# Patient Record
Sex: Female | Born: 1980 | Race: White | Hispanic: No | Marital: Single | Smoking: Current every day smoker
Health system: Southern US, Community
[De-identification: ages and names within clinical notes are randomized; demographics above are authoritative.]

## PROBLEM LIST (undated history)

## (undated) DIAGNOSIS — O2492 Unspecified diabetes mellitus in childbirth: Secondary | ICD-10-CM

## (undated) DIAGNOSIS — G43909 Migraine, unspecified, not intractable, without status migrainosus: Secondary | ICD-10-CM

## (undated) HISTORY — PX: EXTERNAL AUDITORY CANAL RECONSTRUCTION: SHX428

---
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
## 2003-02-20 ENCOUNTER — Encounter (INDEPENDENT_AMBULATORY_CARE_PROVIDER_SITE_OTHER): Payer: Self-pay | Admitting: Specialist

## 2003-02-20 ENCOUNTER — Ambulatory Visit (HOSPITAL_COMMUNITY): Admission: EM | Admit: 2003-02-20 | Discharge: 2003-02-20 | Payer: Self-pay | Admitting: *Deleted

## 2003-09-28 ENCOUNTER — Inpatient Hospital Stay (HOSPITAL_COMMUNITY): Admission: AD | Admit: 2003-09-28 | Discharge: 2003-09-28 | Payer: Self-pay | Admitting: Obstetrics and Gynecology

## 2003-11-01 ENCOUNTER — Other Ambulatory Visit: Admission: RE | Admit: 2003-11-01 | Discharge: 2003-11-01 | Payer: Self-pay | Admitting: Obstetrics and Gynecology

## 2004-02-13 ENCOUNTER — Inpatient Hospital Stay (HOSPITAL_COMMUNITY): Admission: AD | Admit: 2004-02-13 | Discharge: 2004-02-14 | Payer: Self-pay | Admitting: Obstetrics and Gynecology

## 2004-03-28 ENCOUNTER — Inpatient Hospital Stay (HOSPITAL_COMMUNITY): Admission: AD | Admit: 2004-03-28 | Discharge: 2004-03-28 | Payer: Self-pay | Admitting: Obstetrics and Gynecology

## 2004-03-29 ENCOUNTER — Inpatient Hospital Stay (HOSPITAL_COMMUNITY): Admission: AD | Admit: 2004-03-29 | Discharge: 2004-03-30 | Payer: Self-pay | Admitting: Obstetrics and Gynecology

## 2004-04-01 ENCOUNTER — Inpatient Hospital Stay (HOSPITAL_COMMUNITY): Admission: AD | Admit: 2004-04-01 | Discharge: 2004-04-01 | Payer: Self-pay | Admitting: Obstetrics and Gynecology

## 2004-05-01 ENCOUNTER — Inpatient Hospital Stay (HOSPITAL_COMMUNITY): Admission: AD | Admit: 2004-05-01 | Discharge: 2004-05-01 | Payer: Self-pay | Admitting: Obstetrics and Gynecology

## 2004-05-11 ENCOUNTER — Inpatient Hospital Stay (HOSPITAL_COMMUNITY): Admission: AD | Admit: 2004-05-11 | Discharge: 2004-05-12 | Payer: Self-pay | Admitting: Obstetrics and Gynecology

## 2004-05-22 ENCOUNTER — Inpatient Hospital Stay (HOSPITAL_COMMUNITY): Admission: AD | Admit: 2004-05-22 | Discharge: 2004-05-22 | Payer: Self-pay | Admitting: Obstetrics and Gynecology

## 2004-05-23 ENCOUNTER — Inpatient Hospital Stay (HOSPITAL_COMMUNITY): Admission: AD | Admit: 2004-05-23 | Discharge: 2004-05-25 | Payer: Self-pay | Admitting: Obstetrics and Gynecology

## 2007-12-07 ENCOUNTER — Inpatient Hospital Stay (HOSPITAL_COMMUNITY): Admission: AD | Admit: 2007-12-07 | Discharge: 2007-12-07 | Payer: Self-pay | Admitting: Obstetrics and Gynecology

## 2009-05-10 ENCOUNTER — Emergency Department (HOSPITAL_COMMUNITY): Admission: EM | Admit: 2009-05-10 | Discharge: 2009-05-10 | Payer: Self-pay | Admitting: Family Medicine

## 2009-05-27 ENCOUNTER — Inpatient Hospital Stay (HOSPITAL_COMMUNITY): Admission: AD | Admit: 2009-05-27 | Discharge: 2009-05-27 | Payer: Self-pay | Admitting: Obstetrics and Gynecology

## 2009-07-08 ENCOUNTER — Inpatient Hospital Stay (HOSPITAL_COMMUNITY): Admission: AD | Admit: 2009-07-08 | Discharge: 2009-07-08 | Payer: Self-pay | Admitting: Obstetrics and Gynecology

## 2009-07-08 ENCOUNTER — Ambulatory Visit: Payer: Self-pay | Admitting: Family

## 2009-09-07 ENCOUNTER — Inpatient Hospital Stay (HOSPITAL_COMMUNITY): Admission: RE | Admit: 2009-09-07 | Discharge: 2009-09-09 | Payer: Self-pay | Admitting: Obstetrics and Gynecology

## 2009-10-17 ENCOUNTER — Emergency Department (HOSPITAL_COMMUNITY): Admission: EM | Admit: 2009-10-17 | Discharge: 2009-10-17 | Payer: Self-pay | Admitting: Family Medicine

## 2009-12-27 ENCOUNTER — Ambulatory Visit (HOSPITAL_COMMUNITY): Admission: RE | Admit: 2009-12-27 | Discharge: 2009-12-27 | Payer: Self-pay | Admitting: Obstetrics and Gynecology

## 2010-09-23 LAB — URINALYSIS, ROUTINE W REFLEX MICROSCOPIC
Bilirubin Urine: NEGATIVE
Glucose, UA: NEGATIVE mg/dL
Nitrite: NEGATIVE
Protein, ur: NEGATIVE mg/dL

## 2010-09-23 LAB — GLUCOSE, CAPILLARY: Glucose-Capillary: 101 mg/dL — ABNORMAL HIGH (ref 70–99)

## 2010-09-23 LAB — URINE MICROSCOPIC-ADD ON

## 2010-09-30 LAB — CBC
HCT: 34.2 % — ABNORMAL LOW (ref 36.0–46.0)
Hemoglobin: 11.3 g/dL — ABNORMAL LOW (ref 12.0–15.0)
Hemoglobin: 11.7 g/dL — ABNORMAL LOW (ref 12.0–15.0)
MCHC: 33.1 g/dL (ref 30.0–36.0)
MCHC: 33.3 g/dL (ref 30.0–36.0)
MCV: 90.9 fL (ref 78.0–100.0)
Platelets: 129 10*3/uL — ABNORMAL LOW (ref 150–400)
RDW: 15.4 % (ref 11.5–15.5)
WBC: 11.5 10*3/uL — ABNORMAL HIGH (ref 4.0–10.5)

## 2010-09-30 LAB — GLUCOSE, RANDOM: Glucose, Bld: 111 mg/dL — ABNORMAL HIGH (ref 70–99)
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
## 2010-11-23 NOTE — Op Note (Signed)
[name], [name]                            ACCOUNT NO.:  [id number]   MEDICAL RECORD NO.:  [id number]                   PATIENT TYPE:  EMS   LOCATION:  ED                                   FACILITY:  [facility]   PHYSICIAN:  Huel Cote, M.D.              DATE OF BIRTH:  May 21, 1981   DATE OF PROCEDURE:  02/20/2003  DATE OF DISCHARGE:                                 OPERATIVE REPORT   PREOPERATIVE DIAGNOSIS:  Incomplete abortion.   POSTOPERATIVE DIAGNOSIS:  Incomplete abortion.   OPERATION/PROCEDURE:  Suction dilation and curettage.   SURGEON:  Huel Cote, M.D.   ANESTHESIA:  LMAC.   ESTIMATED BLOOD LOSS:  50 mL.   URINE OUTPUT:  50 mL straight catheter prior to procedure.   IV FLUIDS:  Approximately 1200 mL lactated Ringer's.   FINDINGS:  Uterus is approximately eight weeks size prior to the procedure.  It decreased to approximately six weeks size after minimal amount of  products of conception were removed.  The os was fingertip dilated prior to  procedure.   DESCRIPTION OF PROCEDURE:  The patient was taken to the operating room [facility]  anesthesia was obtained without difficulty.  She was then prepped and draped  in the normal sterile fashion in the dorsal lithotomy position. Speculum was  then placed within the vagina, cervix identified, grasped with a single-  tooth tenaculum on the anterior lip and the uterine sound was easily  introduced into a partially dilated cervix and sounded to approximately 8-9  cm.  The serial dilators were then utilized and cervix was dilated up 24.  The dilators passed without any resistance and the cervix was indeed  partially dilated at the outset of the procedure.  With cervix opened, the 8  mm suction curettage was introduced into the uterus and in two passes,  additional tissue was removed.  After these two passes, no further tissue  was obtained.  Suction was discontinued and sharp curettage was placed  within the uterine  cavity.  Some small residual amount of tissue was scraped  and added to the previous specimen.  There was no other major tissue noted.  The suction was placed within the uterus and suction applied and two more  passes with no further tissue obtained.  At this point all instrument and  sponges were removed from the vagina.  The cervix was visualized and no  active bleeding was noted.  Therefore, the patient was awakened and taken to  the recovery room in stable condition.                                               Huel Cote, M.D.    KR/MEDQ  D:  02/20/2003  T:  02/20/2003  Job:  [id number]

## 2011-04-04 LAB — CBC
Hemoglobin: 13.4
MCHC: 34.9
MCV: 92.5
Platelets: 232
RBC: 4.14
RDW: 13.3

## 2011-04-04 LAB — WET PREP, GENITAL
Clue Cells Wet Prep HPF POC: NONE SEEN
Trich, Wet Prep: NONE SEEN

## 2011-04-04 LAB — URINE CULTURE

## 2011-04-04 LAB — URINALYSIS, ROUTINE W REFLEX MICROSCOPIC
Nitrite: NEGATIVE
Specific Gravity, Urine: >1.03 — ABNORMAL HIGH
pH: 6

## 2011-04-04 LAB — URINE MICROSCOPIC-ADD ON

## 2011-04-04 LAB — GC/CHLAMYDIA PROBE AMP, GENITAL
Chlamydia, DNA Probe: NEGATIVE
GC Probe Amp, Genital: NEGATIVE

## 2011-04-04 LAB — ABO/RH: ABO/RH(D): A POS

## 2013-03-02 ENCOUNTER — Emergency Department (HOSPITAL_COMMUNITY)
Admission: EM | Admit: 2013-03-02 | Discharge: 2013-03-03 | Discharge status: Against Medical Advice | Payer: BC Managed Care – PPO | Attending: Emergency Medicine | Admitting: Emergency Medicine

## 2013-03-02 ENCOUNTER — Encounter (HOSPITAL_COMMUNITY): Payer: Self-pay | Admitting: Emergency Medicine

## 2013-03-02 DIAGNOSIS — N949 Unspecified condition associated with female genital organs and menstrual cycle: Secondary | ICD-10-CM | POA: Insufficient documentation

## 2013-03-02 DIAGNOSIS — R1031 Right lower quadrant pain: Secondary | ICD-10-CM | POA: Diagnosis not present

## 2013-03-02 DIAGNOSIS — G43909 Migraine, unspecified, not intractable, without status migrainosus: Secondary | ICD-10-CM | POA: Insufficient documentation

## 2013-03-02 DIAGNOSIS — R110 Nausea: Secondary | ICD-10-CM | POA: Diagnosis not present

## 2013-03-02 DIAGNOSIS — F17200 Nicotine dependence, unspecified, uncomplicated: Secondary | ICD-10-CM | POA: Insufficient documentation

## 2013-03-02 DIAGNOSIS — Z9089 Acquired absence of other organs: Secondary | ICD-10-CM | POA: Insufficient documentation

## 2013-03-02 DIAGNOSIS — Z8632 Personal history of gestational diabetes: Secondary | ICD-10-CM | POA: Insufficient documentation

## 2013-03-02 DIAGNOSIS — Z8719 Personal history of other diseases of the digestive system: Secondary | ICD-10-CM | POA: Insufficient documentation

## 2013-03-02 DIAGNOSIS — R102 Pelvic and perineal pain: Secondary | ICD-10-CM

## 2013-03-02 DIAGNOSIS — Z8711 Personal history of peptic ulcer disease: Secondary | ICD-10-CM | POA: Insufficient documentation

## 2013-03-02 DIAGNOSIS — Z880 Allergy status to penicillin: Secondary | ICD-10-CM | POA: Insufficient documentation

## 2013-03-02 DIAGNOSIS — R51 Headache: Secondary | ICD-10-CM | POA: Diagnosis present

## 2013-03-02 HISTORY — DX: Migraine, unspecified, not intractable, without status migrainosus: G43.909

## 2013-03-02 HISTORY — DX: Unspecified diabetes mellitus in childbirth: O24.92

## 2013-03-02 LAB — COMPREHENSIVE METABOLIC PANEL
ALT: 20 U/L (ref 0–35)
Alkaline Phosphatase: 61 U/L (ref 39–117)
BUN: 17 mg/dL (ref 6–23)
CO2: 23 mEq/L (ref 19–32)
Calcium: 9.7 mg/dL (ref 8.4–10.5)
GFR calc Af Amer: >90 mL/min (ref >90)
Potassium: 3.8 mEq/L (ref 3.5–5.1)
Total Bilirubin: 0.2 mg/dL — ABNORMAL LOW (ref 0.3–1.2)
Total Protein: 7.4 g/dL (ref 6.0–8.3)

## 2013-03-02 LAB — CBC WITH DIFFERENTIAL/PLATELET
Basophils Absolute: 0 10*3/uL (ref 0.0–0.1)
Basophils Relative: 0 % (ref 0–1)
Eosinophils Relative: 1 % (ref 0–5)
Lymphs Abs: 3 10*3/uL (ref 0.7–4.0)
MCV: 87.5 fL (ref 78.0–100.0)
Monocytes Absolute: 0.4 10*3/uL (ref 0.1–1.0)
Neutro Abs: 6.1 10*3/uL (ref 1.7–7.7)
Neutrophils Relative %: 64 % (ref 43–77)
Platelets: 221 10*3/uL (ref 150–400)
RBC: 4.96 MIL/uL (ref 3.87–5.11)
RDW: 13.5 % (ref 11.5–15.5)

## 2013-03-02 LAB — URINALYSIS, ROUTINE W REFLEX MICROSCOPIC
Bilirubin Urine: NEGATIVE
Glucose, UA: NEGATIVE mg/dL
Protein, ur: NEGATIVE mg/dL
Urobilinogen, UA: 0.2 mg/dL (ref 0.0–1.0)

## 2013-03-02 MED ORDER — ONDANSETRON 4 MG PO TBDP
8.0000 mg | ORAL_TABLET | Freq: Once | ORAL | Status: AC
  Administered 2013-03-02: 8 mg via ORAL
  Filled 2013-03-02: qty 2

## 2013-03-02 MED ORDER — KETOROLAC TROMETHAMINE 30 MG/ML IJ SOLN
30.0000 mg | Freq: Once | INTRAMUSCULAR | Status: AC
  Administered 2013-03-02: 30 mg via INTRAVENOUS
  Filled 2013-03-02: qty 1

## 2013-03-02 MED ORDER — SODIUM CHLORIDE 0.9 % IV BOLUS (SEPSIS)
1000.0000 mL | Freq: Once | INTRAVENOUS | Status: AC
  Administered 2013-03-02: 1000 mL via INTRAVENOUS

## 2013-03-02 MED ORDER — SUMATRIPTAN SUCCINATE 6 MG/0.5ML ~~LOC~~ SOLN
6.0000 mg | Freq: Once | SUBCUTANEOUS | Status: AC
  Administered 2013-03-02: 6 mg via SUBCUTANEOUS
  Filled 2013-03-02: qty 0.5

## 2013-03-02 MED ORDER — ONDANSETRON HCL 4 MG/2ML IJ SOLN
4.0000 mg | Freq: Once | INTRAMUSCULAR | Status: AC
  Administered 2013-03-02: 4 mg via INTRAVENOUS
  Filled 2013-03-02: qty 2

## 2013-03-02 NOTE — ED Provider Notes (Signed)
CSN: [id number]     Arrival date & time 03/02/13  1945 History   First MD Initiated Contact with Patient 03/02/13 2149     Chief Complaint  Patient presents with  . Abdominal Pain  . Headache   (Consider location/radiation/quality/duration/timing/severity/associated sxs/prior Treatment) The history is provided by the patient and medical records. No language interpreter was used.    Amy Lindsey is a 32 y.o. female  with a hx of Gestational DM, migraine, GERD, PUD, ADHD, tonsillectomy, cholecystectomy presents to the Emergency Department complaining of gradual, persistent, progressively worsening headache onset 1 week ago.  Pt describes HA as pressure that begins behind her right eye that radiates around the right side of her head to the back.  Denies fever, chills, neck pain, neck stiffness.  Pt with Hx of migraines and the pain feels the same but she usually has light/noise/smell sensitivity which is absent at this time. Pt denies nasal congestions, otalgia, cough, chest congestion.  Pt with associated lower abdominal pain.  Pt states pain is constant and dull with intermittent stabbing episodes. The pain began 3 days and has been unchanged.  Nothing makes any of the symptoms better and bright lights make the headache worse.   Pt denies fever, chills, neck pain, chest pain, V/D, weakness, dizziness, syncope, dysuria, hematuria.   Sexually active with 1 partner for 7 years, no hx of STD.     Past Medical History  Diagnosis Date  . Migraine   . 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 Gallbladder surgery  1995    removed  . Adenoidectomy, tonsillectomy and myringotomy with tube placement  1998  . External auditory canal reconstruction      eardrum repair    History reviewed. No pertinent family history. History  Substance Use Topics  . Smoking status: Current Every Day Smoker -- 0.50 packs/day    Types: Cigarettes  . Smokeless tobacco:  Not on file  . Alcohol Use: No   OB History   Grav Para Term Preterm Abortions TAB SAB Ect Mult Living                 Review of Systems  Constitutional: Negative for fever, diaphoresis, appetite change, fatigue and unexpected weight change.  HENT: Negative for mouth sores and neck stiffness.   Eyes: Negative for visual disturbance.  Respiratory: Negative for cough, chest tightness, shortness of breath and wheezing.   Cardiovascular: Negative for chest pain.  Gastrointestinal: Positive for nausea and abdominal pain. Negative for vomiting, diarrhea and constipation.  Endocrine: Negative for polydipsia, polyphagia and polyuria.  Genitourinary: Negative for dysuria, urgency, frequency and hematuria.  Musculoskeletal: Negative for back pain.  Skin: Negative for rash.  Allergic/Immunologic: Negative for immunocompromised state.  Neurological: Positive for headaches. Negative for syncope and light-headedness.  Hematological: Does not bruise/bleed easily.  Psychiatric/Behavioral: Negative for sleep disturbance. The patient is not nervous/anxious.     Allergies  Cephalosporins; Penicillins; and Wellbutrin  Home Medications   Current Outpatient Rx  Name  Route  Sig  Dispense  Refill  . SUMAtriptan (IMITREX) 100 MG tablet   Oral   Take 1 tablet (100 mg total) by mouth every 2 (two) hours as needed for migraine.   10 tablet   0    BP 115/55  Pulse 66  Temp(Src) 97.5 F (36.4 C) (Oral)  Resp 16  SpO2 100% Physical Exam  Nursing note and vitals reviewed. Constitutional: She is oriented  to person, place, and time. She appears well-developed and well-nourished. No distress.  Awake, alert, nontoxic appearance  HENT:  Head: Normocephalic and atraumatic.  Mouth/Throat: Oropharynx is clear and moist. No oropharyngeal exudate.  Eyes: Conjunctivae and EOM are normal. Pupils are equal, round, and reactive to light. No scleral icterus.  Neck: Normal range of motion. Neck supple.   Cardiovascular: Normal rate, regular rhythm, normal heart sounds and intact distal pulses.   No murmur heard. Pulmonary/Chest: Effort normal and breath sounds normal. No respiratory distress. She has no wheezes. She has no rales.  Abdominal: Soft. Bowel sounds are normal. She exhibits no distension and no mass. There is tenderness in the right lower quadrant. There is guarding (mild). There is no rigidity, no rebound and no tenderness at McBurney's point.  Abdominal tenderness to the right lower quadrant/pelvic region without rebound  Musculoskeletal: Normal range of motion. She exhibits no edema.  Lymphadenopathy:    She has no cervical adenopathy.  Neurological: She is alert and oriented to person, place, and time. She has normal reflexes. No cranial nerve deficit. She exhibits normal muscle tone. Coordination normal. GCS eye subscore is 4. GCS verbal subscore is 5. GCS motor subscore is 6.  Speech is clear and goal oriented, follows commands Cranial nerves III - XII without deficit, no facial droop Normal strength in upper and lower extremities bilaterally, strong and equal grip strength Sensation normal to light and sharp touch Moves extremities without ataxia, coordination intact Normal finger to nose and rapid alternating movements Neg romberg, no pronator drift Normal gait Normal heel-shin and balance  Skin: Skin is warm and dry. No rash noted. She is not diaphoretic.  Psychiatric: She has a normal mood and affect. Her behavior is normal. Judgment and thought content normal.    ED Course  Procedures (including critical care time) Labs Review Labs Reviewed  CBC WITH DIFFERENTIAL - Abnormal; Notable for the following:    Hemoglobin 15.2 (*)    All other components within normal limits  COMPREHENSIVE METABOLIC PANEL - Abnormal; Notable for the following:    Total Bilirubin 0.2 (*)    All other components within normal limits  URINALYSIS, ROUTINE W REFLEX MICROSCOPIC - Abnormal;  Notable for the following:    Hgb urine dipstick TRACE (*)    All other components within normal limits  URINE MICROSCOPIC-ADD ON - Abnormal; Notable for the following:    Squamous Epithelial / LPF FEW (*)    All other components within normal limits  WET PREP, GENITAL  GC/CHLAMYDIA PROBE AMP   Imaging Review No results found.  MDM   1. Migraine headache   2. Pelvic pain      GENESEE NASE presents with headache and right lower quadrant abdominal/pelvic pain.  Patient headache not consistent with her previous migraines as it lacks some of the common symptoms however she states the pain itself feels the same.  We'll treat her migraine headache and reassess.  Right lower quadrant tenderness more pelvic on exam than abdomen. Patient without leukocytosis and afebrile. We'll perform pelvic exam and reassess.  11:45pm Patient reports complete resolution of headache and nausea. Persistent right pelvic pain. She reports that her son is at home having an asthma attack and she must leave. She is refusing pelvic exam and further exam of her abdomen.  Discussed at length with her the importance of following up on this. Also discussed precautions for early appendicitis. Patient reports she will followup with her OB/GYN tomorrow morning for further  evaluation of her pelvic pain.  She is alert, oriented, nontoxic, nonseptic appearing. She remains afebrile and non-tachycardic.  I have also discussed reasons to return immediately to the ER.  Patient expresses understanding and agrees with plan.  Patient to be discharged AMA.    Dahlia Client Eythan Jayne, PA-C 03/03/13 [phone]

## 2013-03-02 NOTE — ED Notes (Signed)
Pt ambulated to the ED complaining of of severe headache for the past week. Pt states that she use to suffer off bad migraines but have not had one for a long time. The patient states that she is nauseous and she has abdominal sharp pain to the right lower quadrant, that worsens when walking with no relief.

## 2013-03-02 NOTE — ED Notes (Signed)
Triage supervised by Gennie Alma RN. Headache for past week, sharp lower abdominal pain worsening today radiating to groin. No vaginal bleeding or discharge. NO focal neuro Sx

## 2013-03-03 DIAGNOSIS — G43909 Migraine, unspecified, not intractable, without status migrainosus: Secondary | ICD-10-CM | POA: Diagnosis not present

## 2013-03-03 MED ORDER — SUMATRIPTAN SUCCINATE 100 MG PO TABS: 100.0000 mg | ORAL_TABLET | ORAL | Status: DC | PRN

## 2013-03-03 NOTE — ED Provider Notes (Signed)
Medical screening examination/treatment/procedure(s) were performed by non-physician practitioner and as supervising physician I was immediately available for consultation/collaboration.   Graeden Bitner, MD 03/03/13 0030 

## 2013-03-03 NOTE — ED Notes (Signed)
Pt. Refused pelvic told MD she had to leave. PA-C informed her she would be considered AMA. Pt. Still insisted on leaving anyway.

## 2013-04-15 ENCOUNTER — Emergency Department (HOSPITAL_COMMUNITY)
Admission: EM | Admit: 2013-04-15 | Discharge: 2013-04-15 | Disposition: A | Discharge status: Home / Self Care | Payer: BC Managed Care – PPO | Attending: Emergency Medicine | Admitting: Emergency Medicine

## 2013-04-15 ENCOUNTER — Encounter (HOSPITAL_COMMUNITY): Payer: Self-pay | Admitting: Emergency Medicine

## 2013-04-15 ENCOUNTER — Emergency Department (HOSPITAL_COMMUNITY): Payer: BC Managed Care – PPO

## 2013-04-15 DIAGNOSIS — S93402A Sprain of unspecified ligament of left ankle, initial encounter: Secondary | ICD-10-CM

## 2013-04-15 DIAGNOSIS — G43909 Migraine, unspecified, not intractable, without status migrainosus: Secondary | ICD-10-CM | POA: Insufficient documentation

## 2013-04-15 DIAGNOSIS — Y9289 Other specified places as the place of occurrence of the external cause: Secondary | ICD-10-CM | POA: Insufficient documentation

## 2013-04-15 DIAGNOSIS — Y9389 Activity, other specified: Secondary | ICD-10-CM | POA: Insufficient documentation

## 2013-04-15 DIAGNOSIS — X500XXA Overexertion from strenuous movement or load, initial encounter: Secondary | ICD-10-CM | POA: Insufficient documentation

## 2013-04-15 DIAGNOSIS — Z79899 Other long term (current) drug therapy: Secondary | ICD-10-CM | POA: Insufficient documentation

## 2013-04-15 DIAGNOSIS — R296 Repeated falls: Secondary | ICD-10-CM | POA: Insufficient documentation

## 2013-04-15 DIAGNOSIS — R21 Rash and other nonspecific skin eruption: Secondary | ICD-10-CM | POA: Insufficient documentation

## 2013-04-15 DIAGNOSIS — S93409A Sprain of unspecified ligament of unspecified ankle, initial encounter: Secondary | ICD-10-CM | POA: Insufficient documentation

## 2013-04-15 DIAGNOSIS — F17200 Nicotine dependence, unspecified, uncomplicated: Secondary | ICD-10-CM | POA: Insufficient documentation

## 2013-04-15 DIAGNOSIS — E119 Type 2 diabetes mellitus without complications: Secondary | ICD-10-CM | POA: Insufficient documentation

## 2013-04-15 DIAGNOSIS — Z880 Allergy status to penicillin: Secondary | ICD-10-CM | POA: Insufficient documentation

## 2013-04-15 MED ORDER — HYDROCODONE-ACETAMINOPHEN 5-325 MG PO TABS: 2.0000 | ORAL_TABLET | ORAL | Status: DC | PRN

## 2013-04-15 MED ORDER — HYDROCODONE-ACETAMINOPHEN 5-325 MG PO TABS
1.0000 | ORAL_TABLET | Freq: Once | ORAL | Status: AC
  Administered 2013-04-15: 1 via ORAL
  Filled 2013-04-15: qty 1

## 2013-04-15 MED ORDER — IBUPROFEN 800 MG PO TABS: 800.0000 mg | ORAL_TABLET | Freq: Three times a day (TID) | ORAL | Status: DC

## 2013-04-15 NOTE — ED Notes (Signed)
Patient transported to X-ray 

## 2013-04-15 NOTE — ED Notes (Signed)
Pt from home c/o left ankle pain and right knee pain. Per pt. Pt rolled left ankle fell on knee yesterday.

## 2013-04-15 NOTE — ED Provider Notes (Signed)
CSN: [id number]     Arrival date & time 04/15/13  1037 History  This chart was scribed for Fayrene Helper, PA, working with Candyce Churn, MD, by Allene Dillon, ED Scribe. This patient was seen in room TR06C/TR06C and the patient's care was started at 10:42 AM.    No chief complaint on file.   The history is provided by the patient. No language interpreter was used.  HPI Comments: Amy Lindsey is a 32 y.o. female who presents to the Emergency Department complaining of "9/10" left ankle pain which began yesterday 8am. Pt states she was walking her dog on side walk and rolled on ankle causing her to fall on her right knee. Pt describes constant throbbing pain. Pt kept leg elevated, and iced with relief and took motrin, with which decreased swelling. Pt took motrin this morning with no pain relief. Pt has associated subjective numbness on first 3 left toes. Pt previously rolled ankle 4 months ago which healed well. Pt states she broke her ankle when she was younger but does not remember which ankle.     Pt also complains of right knee pain, Pt Neoporin with bandaging with mild relief. Pt describes pain as throbbing, burning, and "stretching".  Pt has associated swelling. Pain worsens with ambulation. Pt previously had riight knee injury when she was younger while playing basketball. Pt las tetanus shot was 3 years ago. Pt denies any surgery on knee. Pt denies weakness, other any other symptoms.   Allergies  Allergen Reactions  . Cephalosporins Hives  . Penicillins Hives  . Wellbutrin [Bupropion] Hives       Past Medical History  Diagnosis Date  . Migraine   . 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 Gallbladder surgery  1995    removed  . Adenoidectomy, tonsillectomy and myringotomy with tube placement  1998  . External auditory canal reconstruction      eardrum repair    No family history on file. History  Substance Use Topics   . Smoking status: Current Every Day Smoker -- 0.50 packs/day    Types: Cigarettes  . Smokeless tobacco: Not on file  . Alcohol Use: No   OB History   Grav Para Term Preterm Abortions TAB SAB Ect Mult Living                 Review of Systems  Musculoskeletal: Positive for myalgias (left ankle). Negative for gait problem.  Skin: Positive for rash (abbrasion on right knee).  Neurological: Positive for numbness (subjective numbness in first 3 left toes). Negative for weakness.    Allergies  Cephalosporins; Penicillins; and Wellbutrin  Home Medications   Current Outpatient Rx  Name  Route  Sig  Dispense  Refill  . SUMAtriptan (IMITREX) 100 MG tablet   Oral   Take 1 tablet (100 mg total) by mouth every 2 (two) hours as needed for migraine.   10 tablet   0    Triage Vitals: BP 107/68  Pulse 74  Temp(Src) 97.5 F (36.4 C) (Oral)  Resp 16  SpO2 100%  Physical Exam  Nursing note and vitals reviewed. Constitutional: She is oriented to person, place, and time. She appears well-developed and well-nourished. No distress.  HENT:  Head: Normocephalic and atraumatic.  Right Ear: External ear normal.  Left Ear: External ear normal.  Nose: Nose normal.  Mouth/Throat: Oropharynx is clear and moist.  Eyes: Conjunctivae are normal.  Neck: Normal  range of motion.  Cardiovascular: Normal rate, regular rhythm and normal heart sounds.   Pulmonary/Chest: Effort normal and breath sounds normal. No stridor. No respiratory distress. She has no wheezes. She has no rales.  Abdominal: Soft. She exhibits no distension.  Musculoskeletal: Normal range of motion. She exhibits tenderness.  Right knee small abrasion noted on the anterior lateral aspect of knee. Full ROM, mild tenderness to palpation. no obvious deformity. No foreign object noted.  Left ankle lateral malleolus, proximal dorsum of ankle with decreased plantar flexion and dorsal flexion. increasing pain with foot inversion and  eversion. No obvious deformity, no underline skin changes.  Neurological: She is alert and oriented to person, place, and time. She has normal strength.  Skin: Skin is warm and dry. She is not diaphoretic. No erythema.  Psychiatric: She has a normal mood and affect. Her behavior is normal.    ED Course  Procedures (including critical care time)  DIAGNOSTIC STUDIES: Oxygen Saturation is 100% on RA, normal by my interpretation.    COORDINATION OF CARE: 10:45 AM- Pt advised of plan for treatment which includes x-ray and pt agrees.  11:51 AM Xray neg for fx or dislocation.  Will d/c with ASO, crutches, pain medication and RICE therapy.  Pt agrees with plan.  Ortho referrall as needed.    Medications  HYDROcodone-acetaminophen (NORCO/VICODIN) 5-325 MG per tablet 1 tablet (1 tablet Oral Given 04/15/13 1131)      Labs Review Labs Reviewed - No data to display Imaging Review Dg Ankle Complete Left  04/15/2013   CLINICAL DATA:  32 year old female status post fall and twisting injury with pain and swelling.  EXAM: LEFT ANKLE COMPLETE - 3+ VIEW  COMPARISON:  None.  FINDINGS: Bone mineralization is within normal limits. Preserved mortise joint alignment. Talar dome intact. Small ossific fragment distal to the lateral malleolus, but appears to be chronic. No joint effusion identified. Calcaneus intact. No acute fracture identified.  IMPRESSION: Small chronic appearing ossific fragment adjacent to the lateral malleolus. No acute fracture or dislocation identified about the left ankle.   Electronically Signed   By: Augusto Gamble M.D.   On: 04/15/2013 11:36    EKG Interpretation   None       MDM   1. Left ankle sprain, initial encounter    BP 107/68  Pulse 74  Temp(Src) 97.5 F (36.4 C) (Oral)  Resp 16  SpO2 100%  LMP 03/31/2013  I have reviewed nursing notes and vital signs. I personally reviewed the imaging tests through PACS system  I reviewed available ER/hospitalization records  thought the EMR   I personally performed the services described in this documentation, which was scribed in my presence. The recorded information has been reviewed and is accurate.     Fayrene Helper, PA-C 04/15/13 1151

## 2013-04-17 NOTE — ED Provider Notes (Signed)
Medical screening examination/treatment/procedure(s) were performed by non-physician practitioner and as supervising physician I was immediately available for consultation/collaboration.   Candyce Churn, MD 04/17/13 [phone]

## 2013-09-11 ENCOUNTER — Emergency Department (HOSPITAL_COMMUNITY)
Admission: EM | Admit: 2013-09-11 | Discharge: 2013-09-11 | Disposition: A | Discharge status: Home / Self Care | Payer: BC Managed Care – PPO | Attending: Emergency Medicine | Admitting: Emergency Medicine

## 2013-09-11 ENCOUNTER — Encounter (HOSPITAL_COMMUNITY): Payer: Self-pay | Admitting: Emergency Medicine

## 2013-09-11 DIAGNOSIS — S99922A Unspecified injury of left foot, initial encounter: Secondary | ICD-10-CM

## 2013-09-11 DIAGNOSIS — Y929 Unspecified place or not applicable: Secondary | ICD-10-CM | POA: Insufficient documentation

## 2013-09-11 DIAGNOSIS — Z79899 Other long term (current) drug therapy: Secondary | ICD-10-CM | POA: Insufficient documentation

## 2013-09-11 DIAGNOSIS — W208XXA Other cause of strike by thrown, projected or falling object, initial encounter: Secondary | ICD-10-CM | POA: Insufficient documentation

## 2013-09-11 DIAGNOSIS — G43909 Migraine, unspecified, not intractable, without status migrainosus: Secondary | ICD-10-CM | POA: Insufficient documentation

## 2013-09-11 DIAGNOSIS — IMO0002 Reserved for concepts with insufficient information to code with codable children: Secondary | ICD-10-CM | POA: Insufficient documentation

## 2013-09-11 DIAGNOSIS — Z888 Allergy status to other drugs, medicaments and biological substances status: Secondary | ICD-10-CM | POA: Insufficient documentation

## 2013-09-11 DIAGNOSIS — F17200 Nicotine dependence, unspecified, uncomplicated: Secondary | ICD-10-CM | POA: Insufficient documentation

## 2013-09-11 DIAGNOSIS — Z881 Allergy status to other antibiotic agents status: Secondary | ICD-10-CM | POA: Insufficient documentation

## 2013-09-11 DIAGNOSIS — Y939 Activity, unspecified: Secondary | ICD-10-CM | POA: Insufficient documentation

## 2013-09-11 DIAGNOSIS — Z880 Allergy status to penicillin: Secondary | ICD-10-CM | POA: Insufficient documentation

## 2013-09-11 MED ORDER — OXYCODONE-ACETAMINOPHEN 5-325 MG PO TABS: 2.0000 | ORAL_TABLET | ORAL | Status: DC | PRN

## 2013-09-11 MED ORDER — HYDROCODONE-ACETAMINOPHEN 5-325 MG PO TABS
2.0000 | ORAL_TABLET | Freq: Once | ORAL | Status: AC
  Administered 2013-09-11: 2 via ORAL
  Filled 2013-09-11: qty 2

## 2013-09-11 NOTE — Discharge Instructions (Signed)
Take Percocet as needed for pain. Continue to ice and elevate the foot. Refer to attached documents for more information.

## 2013-09-11 NOTE — ED Provider Notes (Signed)
CSN: [id number]     Arrival date & time 09/11/13  1402 History  This chart was scribed for non-physician practitioner, Emilia Beck, PA-C, working with Enid Skeens, MD by Shari Heritage, ED Scribe. This patient was seen in room TR06C/TR06C and the patient's care was started at 3:53 PM.      Chief Complaint  Patient presents with  . Foot Injury     Patient is a 33 y.o. female presenting with foot injury.  Foot Injury Location:  Foot Injury: yes   Mechanism of injury: crush   Crush injury:    Mechanism:  Falling object Foot location:  L foot Pain details:    Severity:  Severe   Timing:  Constant Chronicity:  New   HPI Comments: Amy Lindsey is a 33 y.o. female who presents to the Emergency Department complaining of constant, severe left 3rd toe pain onset yesterday after patient dropped a dish on her foot. She states that pain is worse with weight bearing and ambulation. She took 800 mg of ibuprofen at home with only mild relief. Patient has a laceration to the left 3rd toe and bruising. She says that she cleaned the lacerations with peroxide and elevated and ice her foot. She denies numbness or weakness of the extremities. There are no other injuries or symptoms at this time. Patient's medical history includes gestational diabetes and migraines.    Past Medical History  Diagnosis Date  . Migraine   . 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 Gallbladder surgery  1995    removed  . Adenoidectomy, tonsillectomy and myringotomy with tube placement  1998  . External auditory canal reconstruction      eardrum repair    History reviewed. No pertinent family history. History  Substance Use Topics  . Smoking status: Current Every Day Smoker -- 0.50 packs/day    Types: Cigarettes  . Smokeless tobacco: Not on file  . Alcohol Use: No   OB History   Grav Para Term Preterm Abortions TAB SAB Ect Mult Living                  Review of Systems  Skin: Positive for wound.  Neurological: Negative for weakness and numbness.  All other systems reviewed and are negative.      Allergies  Cephalosporins; Penicillins; and Wellbutrin  Home Medications   Current Outpatient Rx  Name  Route  Sig  Dispense  Refill  . ibuprofen (ADVIL,MOTRIN) 200 MG tablet   Oral   Take 800 mg by mouth every 8 (eight) hours as needed for moderate pain.         . SUMAtriptan (IMITREX) 100 MG tablet   Oral   Take 100 mg by mouth every 2 (two) hours as needed for migraine or headache. May repeat in 2 hours if headache persists or recurs.          Triage Vitals: BP 145/107  Pulse 69  Temp(Src) 98.5 F (36.9 C) (Oral)  Resp 20  SpO2 98% Physical Exam  Nursing note and vitals reviewed. Constitutional: She is oriented to person, place, and time. She appears well-developed and well-nourished. No distress.  HENT:  Head: Normocephalic and atraumatic.  Eyes: EOM are normal.  Neck: Neck supple. No tracheal deviation present.  Cardiovascular: Normal rate.   Pulmonary/Chest: Effort normal. No respiratory distress.  Musculoskeletal:       Left foot: She exhibits tenderness.  Left 3rd toe  with tenderness to palpation and bruising. No obvious deformity. Small abrasion noted to dorsal aspect of toe.  Neurological: She is alert and oriented to person, place, and time.  Skin: Skin is warm and dry.  Psychiatric: She has a normal mood and affect. Her behavior is normal.    ED Course  Procedures (including critical care time) DIAGNOSTIC STUDIES: Oxygen Saturation is 98% on room air, normal by my interpretation.    COORDINATION OF CARE: 3:56 PM- Patient states that pain is improved after Vicodin. Will prescribe short course of Percocet because she states Vicodin gives her constipation. Patient informed of current plan for treatment and evaluation and agrees with plan at this time.    MDM   Final diagnoses:  Injury of left  foot    4:01 PM Patient given Vicodin for pain. Patient does not want an xray. Patient will have buddy tape on toes and be discharged with pain medication. Vitals stable and patient afebrile. Patient denies any other injury.   I personally performed the services described in this documentation, which was scribed in my presence. The recorded information has been reviewed and is accurate.    Emilia BeckKaitlyn Kelcee Bjorn, PA-C 09/11/13 1607

## 2013-09-11 NOTE — ED Notes (Signed)
She dropped a dish on her left toes yesterday. She cleaned the cuts with peroxide, iced and elevated, and took 800 mg ibuprofen but shes still having a lot of pain today and making it hard for her to walk

## 2013-09-12 NOTE — ED Provider Notes (Signed)
Medical screening examination/treatment/procedure(s) were performed by non-physician practitioner and as supervising physician I was immediately available for consultation/collaboration.  Samad Thon T Lynnsey Barbara, MD 09/12/13 1601 

## 2013-10-18 ENCOUNTER — Emergency Department (HOSPITAL_COMMUNITY)
Admission: EM | Admit: 2013-10-18 | Discharge: 2013-10-18 | Disposition: A | Discharge status: Home / Self Care | Payer: BC Managed Care – PPO | Attending: Emergency Medicine | Admitting: Emergency Medicine

## 2013-10-18 ENCOUNTER — Encounter (HOSPITAL_COMMUNITY): Payer: Self-pay | Admitting: Emergency Medicine

## 2013-10-18 DIAGNOSIS — K0889 Other specified disorders of teeth and supporting structures: Secondary | ICD-10-CM

## 2013-10-18 DIAGNOSIS — G43909 Migraine, unspecified, not intractable, without status migrainosus: Secondary | ICD-10-CM | POA: Insufficient documentation

## 2013-10-18 DIAGNOSIS — K089 Disorder of teeth and supporting structures, unspecified: Secondary | ICD-10-CM | POA: Diagnosis present

## 2013-10-18 DIAGNOSIS — Z880 Allergy status to penicillin: Secondary | ICD-10-CM | POA: Insufficient documentation

## 2013-10-18 DIAGNOSIS — F17200 Nicotine dependence, unspecified, uncomplicated: Secondary | ICD-10-CM | POA: Diagnosis not present

## 2013-10-18 MED ORDER — HYDROCODONE-ACETAMINOPHEN 5-325 MG PO TABS: 1.0000 | ORAL_TABLET | Freq: Four times a day (QID) | ORAL | Status: AC | PRN

## 2013-10-18 MED ORDER — CLINDAMYCIN HCL 150 MG PO CAPS: 300.0000 mg | ORAL_CAPSULE | Freq: Four times a day (QID) | ORAL | Status: AC

## 2013-10-18 NOTE — Discharge Instructions (Signed)
Read the information below.  Use the prescribed medication as directed.  Please discuss all new medications with your pharmacist.  Do not take additional tylenol while taking the prescribed pain medication to avoid overdose.  You may return to the Emergency Department at any time for worsening condition or any new symptoms that concern you.  If there is any possibility that you might be pregnant, please let your health care provider know and discuss this with the pharmacist to ensure medication safety.  Please call the dentist listed above within 48 hours to schedule a close follow up appointment.  If you develop fevers, swelling in your face, difficulty swallowing or breathing, return to the ER immediately for a recheck.   ° ° °Dental Pain °A tooth ache may be caused by cavities (tooth decay). Cavities expose the nerve of the tooth to air and hot or cold temperatures. It may come from an infection or abscess (also called a boil or furuncle) around your tooth. It is also often caused by dental caries (tooth decay). This causes the pain you are having. °DIAGNOSIS  °Your caregiver can diagnose this problem by exam. °TREATMENT  °· If caused by an infection, it may be treated with medications which kill germs (antibiotics) and pain medications as prescribed by your caregiver. Take medications as directed. °· Only take over-the-counter or prescription medicines for pain, discomfort, or fever as directed by your caregiver. °· Whether the tooth ache today is caused by infection or dental disease, you should see your dentist as soon as possible for further care. °SEEK MEDICAL CARE IF: °The exam and treatment you received today has been provided on an emergency basis only. This is not a substitute for complete medical or dental care. If your problem worsens or new problems (symptoms) appear, and you are unable to meet with your dentist, call or return to this location. °SEEK IMMEDIATE MEDICAL CARE IF:  °· You have a  fever. °· You develop redness and swelling of your face, jaw, or neck. °· You are unable to open your mouth. °· You have severe pain uncontrolled by pain medicine. °MAKE SURE YOU:  °· Understand these instructions. °· Will watch your condition. °· Will get help right away if you are not doing well or get worse. °Document Released: 06/24/2005 Document Revised: 09/16/2011 Document Reviewed: 02/10/2008 °ExitCare® Patient Information ©2014 [employer]. ° ° ° °Emergency Department Resource Guide °1) Find a Doctor and Pay Out of Pocket °Although you won't have to find out who is covered by your insurance plan, it is a good idea to ask around and get recommendations. You will then need to call the office and see if the doctor you have chosen will accept you as a new patient and what types of options they offer for patients who are self-pay. Some doctors offer discounts or will set up payment plans for their patients who do not have insurance, but you will need to ask so you aren't surprised when you get to your appointment. ° °2) Contact Your Local Health Department °Not all health departments have doctors that can see patients for sick visits, but many do, so it is worth a call to see if yours does. If you don't know where your local health department is, you can check in your phone book. The CDC also has a tool to help you locate your state's health department, and many state websites also have listings of all of their local health departments. ° °3) Find a Walk-in   Clinic °If your illness is not likely to be very severe or complicated, you may want to try a walk in clinic. These are popping up all over the country in pharmacies, drugstores, and shopping centers. They're usually staffed by nurse practitioners or physician assistants that have been trained to treat common illnesses and complaints. They're usually fairly quick and inexpensive. However, if you have serious medical issues or chronic medical problems, these  are probably not your best option. ° °No Primary Care Doctor: °- Call [facility] at  832-8000 - they can help you locate a primary care doctor that  accepts your insurance, provides certain services, etc. °- Physician Referral Service- 1-800-533-3463 ° °Chronic Pain Problems: °Organization         Address  Phone   Notes  °[facility]  (336) 297-2271 Patients need to be referred by their primary care doctor.  ° °Medication Assistance: °Organization         Address  Phone   Notes  °[place] [facility] [address] °[place], [place] [address] (336) 641-8030 --Must be a resident of [place] °-- Must have NO insurance coverage whatsoever (no Medicaid/ Medicare, etc.) °-- The pt. MUST have a primary care doctor that directs their care regularly and follows them in the community °  °[facility]  (866) 331-1348   °[employer]  (888) 892-1162   ° °Agencies that provide inexpensive medical care: °Organization         Address  Phone   Notes  °[facility]  (336) 832-8035   °[facility]    (336) 832-7272   °[facility] [address] °[place], [place] [address] (336) 832-4777   °[facility] [address], °[place] (336) 271-4999   °[facility]    (336) 373-0678   °[facility]    (336) 272-1050   °[facility] ° [address], [place] Phone:  (336) 832-4444, Fax:  (336) 832-4440 Hours of Operation:  9 am - 6 pm, M-F.  Also accepts Medicaid/Medicare and self-pay.  °[facility] ° [address], [place] Phone: (336) 832-3150, Fax: (336) 832-3151. Hours of Operation:  8:30 am - 5:30 pm, M-F.  Also accepts Medicaid and self-pay.  °[facility] [place] [address], [place] Phone: (336) 878-6027   °[facility] [address], [place], [place] (336)723-1848, Ext. 123 Mondays &  Thursdays: 7-9 AM.  First 15 patients are seen on a first come, first serve basis. °  ° °Medicaid-accepting [place] Providers: ° °Organization         Address  Phone   Notes  °[facility] [address], [place] (336) 641-2100 Also accepts self-pay patients.  °[facility] [address], [place] ° (336) 856-9996   °[facility] [address], [place] (336) 288-8857   °[facility] [address] [place] [address], [place] (336) 299-7000   °Veita Bland [address], [place]  ° (336) 373-1557 Only accepts [place] Access Medicaid patients after they have their name applied to their card.  ° °Self-Pay (no insurance) in [place]: ° °Organization         Address  Phone   Notes  °Sickle Cell Patients, [facility] [address],  (  336) 832-1970   °[facility] [address], [place] (336) 832-4400   °[facility] ° [address], [place] (336) 992-4800   °[facility]/Dr. Osei-Bonsu ° [address] [place] [address], [place] or [address], [place] (336) 841-8500 Phone number for both [place] and [place] locations is the same.  °[facility] [address], [place] (336) 299-0000   °[facility] [address] [place] [address], [place] or [address] (336) 852-7530 °(336) 878-2260   °[facility] [address], [place] (336) 350-1642, phone; (336) 294-5005, fax Sees patients 1st and 3rd Saturday of every month.  Must not qualify for public or private insurance (i.e. Medicaid, Medicare, [place] Health Choice, Veterans' Benefits) • Household income should be no more than 200% of the poverty level •The clinic cannot treat you if you are pregnant or think you are pregnant • Sexually transmitted diseases are not treated at the  clinic.  ° ° °Dental Care: °Organization         Address  Phone  Notes  °[facility] [facility] [address],  (336) 641-6152 Accepts children up to age 21 who are enrolled in Medicaid or [place] Health Choice; pregnant women with a Medicaid card; and children who have applied for Medicaid or [place] Health Choice, but were declined, whose parents can pay a reduced fee at time of service.  °[facility] [place]  [address], [place] (336) 641-7733 Accepts children up to age 21 who are enrolled in Medicaid or [place] Health Choice; pregnant women with a Medicaid card; and children who have applied for Medicaid or [place] Health Choice, but were declined, whose parents can pay a reduced fee at time of service.  °[facility] ° [address],  (336) 641-4533 Patients are seen by appointment only. Walk-ins are not accepted. [facility] will see patients 18 years of age and older. °Monday - Tuesday (8am-5pm) °Most Wednesdays (8:30-5pm) °$30 per visit, cash only  °[facility] ° [address], [place] (336) 641-4533 Patients are seen by appointment only. Walk-ins are not accepted. [facility] will see patients 18 years of age and older. °One Wednesday Evening (Monthly: Volunteer Based).  $30 per visit, cash only  °[facility]  (919) 537-3737 for adults; Children under age 4, call Graduate Pediatric Dentistry at (919) [phone]. Children aged 4-14, please call (919) 537-3737 to request a pediatric application. ° Dental services are provided in all areas of dental care including fillings, crowns and bridges, complete and partial dentures, implants, gum treatment, root canals, and extractions. Preventive care is also provided. Treatment is provided to both adults and children. °Patients are selected via a lottery and there is often a  waiting list. °  °[facility] [address], ° ° (336) 763-8833 [email or web address] °  °[facility] [address], [place], [place] (336)723-1848, Ext. 123 Second and Fourth Thursday of each month, opens at 6:30 AM; Clinic ends at 9 AM.  Patients are seen on a first-come first-served basis, and a limited number are seen during each clinic.  ° °[facility] ° [address], [place], [place] (336) 723-7904   Eligibility Requirements °You must have lived in [place], [place], or [place] counties   for at least the last three months. °  You cannot be eligible for state or federal sponsored healthcare insurance, including Veterans Administration, Medicaid, or Medicare. °  You generally cannot be eligible for healthcare insurance through your employer.  °  How to apply: °Eligibility screenings are held every Tuesday and Wednesday afternoon from 1:00 pm until 4:00 pm. You do not need an appointment for the interview!  °[facility] [address], [place], [place] 336-631-2330   °[facility]  336-342-8273   °[facility]  336-703-3100   °[facility]  336-570-6415   ° °Behavioral Health Resources in the Community: °Intensive Outpatient Programs °Organization         Address  Phone  Notes  °[facility] [address], [place], [place] 336-878-6098   °[facility] [address], [place], [place] 336-832-9800   °[facility] [address], [place], [place] ° 336-882-2125   °[facility] [address],  °[place], [place] 1-800-853-5163 or 336-641-4981   °Substance Abuse Resources °Organization         Address  Phone  Notes  °[facility]  336-882-2125   °[facility]  336-784-9470   °[facility]  336-285-9073   °[facility]  336-845-3988   °Residential & Outpatient Substance Abuse  Program  1-800-659-3381   °Psychological Services °Organization         Address  Phone  Notes  °[facility]  336- 832-9600   °[facility]  336- 378-7881   °[facility] [address], [place] 1-800-853-5163 or 336-641-4981   ° °Mobile Crisis Teams °Organization         Address  Phone  Notes  °[facility], Mobile Crisis Care Unit  1-877-626-1772   °Assertive °[facility] ° [address] [place], [place] 336-834-9664   °Sharon DeEsch [address] °[place] 336-554-5454   ° °Self-Help/Support Groups °Organization         Address  Phone             Notes  °[facility] [place] - variety of support groups  336- 373-1402 Call for more information  °Narcotics Anonymous (NA), [facility] [address], °[place]  2 meetings at this location  ° °Residential Treatment Programs °Organization         Address  Phone  Notes  °[facility] [address],    °[place]  1-866-801-8205   °[facility] ° [address], [place], [place] 704-293-8524   °[facility] [address], [place] 336-845-3988 Admissions: 8am-3pm M-F  °[facility] [address]    °[place], [place] 336-841-1104   °[facility] [address], [place], [place] 336-379-7146   °[facility] [address]  °[place], [place] 336-285-9073   °[facility] - Intensive Outpatient [address], [place], [place] 336-852-3033   °[facility] [address]  °[place], [place] 1-877-615-2722 or 336-784-9470   °[facility] ([facility] [address] [place], [place] 336-227-7417 Accepts Medicaid  °[facility] [address]  °[place] 1-800-659-3381 Substance Abuse/Addiction Treatment  ° °[place] Behavioral Health Resources °Organization          Address  Phone  Notes  °[facility]  (  888) 581-9988   °Julie Brannon, PhD [address] [place], [place]   (336) 349-5553 or (336) 951-0000   °[facility]   [address] °[place], [place] (336) 349-4454   °[facility] [address], [place], [place] (336) 342-8316 Insurance/Medicaid/sponsorship through [facility]  °[facility] [address]                                    [place], [place] (336) 342-8316 Therapy/tele-psych/case  °[facility] [address]  ° [place], [place] (336) 349-2233    °Dr. Arfeen  (336) 349-4544   °[facility] [place]  [employer] [place] [facility] 1) [address], [place] °2) [address], [place] °3)  [address], [place] (336) 349-3220 °(336) 342-7768 ° °(336) 342-8140   °[place] [facility] (336) 342-1394 or (336) 342-3537 (After Hours)    ° ° ° °

## 2013-10-18 NOTE — ED Provider Notes (Signed)
CSN: [id number]     Arrival date & time 10/18/13  1257 History  This chart was scribed for non-physician practitioner, Trixie DredgeEmily Derral Colucci, PA-C working with Gerhard Munchobert Lockwood, MD by Greggory StallionKayla Andersen, ED scribe. This patient was seen in room TR05C/TR05C and the patient's care was started at 2:05 PM.   Chief Complaint  Patient presents with  . Dental Pain   The history is provided by the patient. No language interpreter was used.   HPI Comments: Amy Lindsey is a 33 y.o. female who presents to the Emergency Department complaining of gradual onset, constant, throbbing right lower dental pain that started this morning. She states her cap broke off. Pt recently moved here and states her insurance has not assigned her a dentist yet. She has taken 800 mg of motrin with no relief. Denies fever, sore throat, facial swelling, trouble swallowing, difficulty breathing.   Past Medical History  Diagnosis Date  . Migraine   . 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 Gallbladder surgery  1995    removed  . Adenoidectomy, tonsillectomy and myringotomy with tube placement  1998  . External auditory canal reconstruction      eardrum repair    History reviewed. No pertinent family history. History  Substance Use Topics  . Smoking status: Current Every Day Smoker -- 0.50 packs/day    Types: Cigarettes  . Smokeless tobacco: Not on file  . Alcohol Use: No   OB History   Grav Para Term Preterm Abortions TAB SAB Ect Mult Living                 Review of Systems  Constitutional: Negative for fever.  HENT: Positive for dental problem. Negative for facial swelling, sore throat and trouble swallowing.   All other systems reviewed and are negative.  Allergies  Cephalosporins; Penicillins; and Wellbutrin  Home Medications   Current Outpatient Rx  Name  Route  Sig  Dispense  Refill  . ibuprofen (ADVIL,MOTRIN) 200 MG tablet   Oral   Take 800 mg by mouth every 8  (eight) hours as needed for moderate pain.         Marland Kitchen. oxyCODONE-acetaminophen (PERCOCET/ROXICET) 5-325 MG per tablet   Oral   Take 2 tablets by mouth every 4 (four) hours as needed for severe pain.   12 tablet   0   . SUMAtriptan (IMITREX) 100 MG tablet   Oral   Take 100 mg by mouth every 2 (two) hours as needed for migraine or headache. May repeat in 2 hours if headache persists or recurs.          BP 108/61  Pulse 84  Temp(Src) 98.7 F (37.1 C) (Oral)  Resp 18  SpO2 94%  LMP 10/18/2013  Physical Exam  Nursing note and vitals reviewed. Constitutional: She appears well-developed and well-nourished. No distress.  HENT:  Head: Normocephalic and atraumatic.  Mouth/Throat: Oropharynx is clear and moist.  Left lower second molar with decay and tenderness to palpation. Adjacent gingiva tender to palpation. No paratracheal tenderness.   Neck: Neck supple.  No anterior cervical lymphadenopathy. No skin changes of anterior neck.   Pulmonary/Chest: Effort normal.  Lymphadenopathy:    She has no cervical adenopathy.  Neurological: She is alert.  Skin: She is not diaphoretic.    ED Course  Procedures (including critical care time)  DIAGNOSTIC STUDIES: Oxygen Saturation is 94% on RA, adequate by my interpretation.    COORDINATION OF  CARE: 2:06 PM-Discussed treatment plan which includes an antibiotic and pain medication with pt at bedside and pt agreed to plan. Will give pt dental referrals and advised her to follow up.   Labs Review Labs Reviewed - No data to display Imaging Review No results found.   EKG Interpretation None      MDM   Final diagnoses:  Pain, dental    Afebrile, nontoxic patient with new dental pain.  No obvious abscess.  No concerning findings on exam.  Doubt deep space head or neck infection.  Doubt Ludwig's angina.  D/C home with antibiotic, pain medication and dental follow up.  Discussed findings, treatment, and follow up  with patient.  Pt  given return precautions.  Pt verbalizes understanding and agrees with plan.      I personally performed the services described in this documentation, which was scribed in my presence. The recorded information has been reviewed and is accurate.  Trixie Dredgemily Mahnoor Mathisen, PA-C 10/18/13 1630

## 2013-10-18 NOTE — ED Notes (Signed)
Patient states she took Ibuprofen 800mg  around 1000am.

## 2013-10-18 NOTE — ED Notes (Signed)
Per pt sts she thinks a cap broke off of one of her right lower teeth during breakfast this am. sts pain and swelling.

## 2013-10-21 NOTE — ED Provider Notes (Signed)
Medical screening examination/treatment/procedure(s) were performed by non-physician practitioner and as supervising physician I was immediately available for consultation/collaboration.  Santiaga Butzin, MD 10/21/13 0718 

## 2014-01-09 ENCOUNTER — Emergency Department (INDEPENDENT_AMBULATORY_CARE_PROVIDER_SITE_OTHER): Payer: Medicaid Other

## 2014-01-09 ENCOUNTER — Emergency Department (HOSPITAL_COMMUNITY)
Admission: EM | Admit: 2014-01-09 | Discharge: 2014-01-09 | Disposition: A | Discharge status: Home / Self Care | Payer: Medicaid Other | Source: Home / Self Care | Attending: Emergency Medicine | Admitting: Emergency Medicine

## 2014-01-09 ENCOUNTER — Encounter (HOSPITAL_COMMUNITY): Payer: Self-pay | Admitting: Emergency Medicine

## 2014-01-09 DIAGNOSIS — J209 Acute bronchitis, unspecified: Secondary | ICD-10-CM

## 2014-01-09 DIAGNOSIS — K047 Periapical abscess without sinus: Secondary | ICD-10-CM

## 2014-01-09 LAB — POCT RAPID STREP A: STREPTOCOCCUS, GROUP A SCREEN (DIRECT): NEGATIVE

## 2014-01-09 MED ORDER — TRAMADOL HCL 50 MG PO TABS: ORAL_TABLET | ORAL | Status: AC

## 2014-01-09 MED ORDER — OXYCODONE-ACETAMINOPHEN 5-325 MG PO TABS: ORAL_TABLET | ORAL | Status: AC

## 2014-01-09 MED ORDER — CLINDAMYCIN HCL 300 MG PO CAPS: 300.0000 mg | ORAL_CAPSULE | Freq: Four times a day (QID) | ORAL | Status: AC

## 2014-01-09 MED ORDER — PREDNISONE 20 MG PO TABS: 20.0000 mg | ORAL_TABLET | Freq: Two times a day (BID) | ORAL | Status: AC

## 2014-01-09 MED ORDER — ALBUTEROL SULFATE HFA 108 (90 BASE) MCG/ACT IN AERS: 2.0000 | INHALATION_SPRAY | Freq: Four times a day (QID) | RESPIRATORY_TRACT | Status: AC

## 2014-01-09 NOTE — ED Notes (Signed)
Patient c/o sx including cough, sore throat, and chest congestion x 1 week. Patient reports her cough is productive and has yellow phlegm. Pt also c/o dental pain on the lower left side. Patient denies fever. She has been taking Advil with no relief.

## 2014-01-09 NOTE — ED Provider Notes (Signed)
Chief Complaint   Chief Complaint  Patient presents with  . URI  . Dental Pain    History of Present Illness   Amy Lindsey is a 33 year old female who's had a one-week history of cough productive green sputum, wheezing, and chest pain in both lower rib cage areas which is worse with a deep breath or with coughing. She smokes about 3 cigarettes a day. No history of asthma. She also had nasal congestion, rhinorrhea, headache, left ear pain, and sore throat.  Her other complaint has been pain in the left, lower second molar. This is partially broken. There's been no swelling of the gingiva. She's able to swallow and breathe well and has no problem with mouth opening or closing.  Review of Systems   Other than as noted above, the patient denies any of the following symptoms: Systemic:  No fevers, chills, sweats, or myalgias. Eye:  No redness or discharge. ENT:  No ear pain, headache, nasal congestion, drainage, sinus pressure, or sore throat. Neck:  No neck pain, stiffness, or swollen glands. Lungs:  No cough, sputum production, hemoptysis, wheezing, chest tightness, shortness of breath or chest pain. GI:  No abdominal pain, nausea, vomiting or diarrhea.  PMFSH   Past medical history, family history, social history, meds, and allergies were reviewed. She's allergic to penicillin, cephalosporins, and Wellbutrin. She has a history of migraine headaches and reflux. She takes ibuprofen and Imitrex.  Physical exam   Vital signs:  BP 114/78  Pulse 81  Temp(Src) 98.1 F (36.7 C) (Oral)  Resp 18  SpO2 96%  LMP 12/15/2013 General:  Alert and oriented.  In no distress.  Skin warm and dry. Eye:  No conjunctival injection or drainage. Lids were normal. ENT:  TMs and canals were normal, without erythema or inflammation.  Nasal mucosa was clear and uncongested, without drainage.  Mucous membranes were moist.  Pharynx was clear with no exudate or drainage.  There were no oral ulcerations or  lesions. Exam of the teeth reveals widespread dental decay. The left, lower second molar was partially broken off, decayed, and there was swelling of the gingiva. No swelling of the floor the mouth. Neck:  Supple, no adenopathy, tenderness or mass. Lungs:  No respiratory distress.  Lungs were clear to auscultation, without wheezes, rales or rhonchi.  Breath sounds were clear and equal bilaterally.  Heart:  Regular rhythm, without gallops, murmers or rubs. Skin:  Clear, warm, and dry, without rash or lesions.  Labs   Results for orders placed during the hospital encounter of 01/09/14  POCT RAPID STREP A ([facility]RG CARE ONLY)      Result Value Ref Range   Streptococcus, Group A Screen (Direct) NEGATIVE  NEGATIVE     Radiology   Dg Chest 2 View  01/09/2014   CLINICAL DATA:  One week history of productive cough  EXAM: CHEST  2 VIEW  COMPARISON:  None.  FINDINGS: The lungs are adequately inflated and clear. The heart and mediastinal structures are normal. There is no pleural effusion. The bony thorax is unremarkable.  IMPRESSION: There is no evidence of pneumonia nor other acute cardiopulmonary abnormality.   Electronically Signed   By: Matilynn Dacey  [place]   On: 01/09/2014 12:55   Assessment     The primary encounter diagnosis was Acute bronchitis, unspecified organism. A diagnosis of Dental infection was also pertinent to this visit.  Plan    1.  Meds:  The following meds were prescribed:   Discharge Medication  List as of 01/09/2014  1:10 PM    START taking these medications   Details  albuterol (PROVENTIL HFA;VENTOLIN HFA) 108 (90 BASE) MCG/ACT inhaler Inhale 2 puffs into the lungs 4 (four) times daily., Starting 01/09/2014, Until Discontinued, Normal    !! clindamycin (CLEOCIN) 300 MG capsule Take 1 capsule (300 mg total) by mouth 4 (four) times daily., Starting 01/09/2014, Until Discontinued, Normal    oxyCODONE-acetaminophen (PERCOCET) 5-325 MG per tablet 1 to 2 tablets every 6 hours as needed for  pain., Print    predniSONE (DELTASONE) 20 MG tablet Take 1 tablet (20 mg total) by mouth 2 (two) times daily., Starting 01/09/2014, Until Discontinued, Normal    traMADol (ULTRAM) 50 MG tablet 1 to 2 tablets every 8 hours as needed for cough., Print     !! - Potential duplicate medications found. Please discuss with provider.      2.  Patient Education/Counseling:  The patient was given appropriate handouts, self care instructions, and instructed in symptomatic relief.  Instructed to get extra fluids, rest, and use a cool mist vaporizer.  Instructed to followup with a dentist as soon as possible.  3.  Follow up:  The patient was told to follow up here if no better in 3 to 4 days, or sooner if becoming worse in any way, and given some red flag symptoms such as increasing fever, difficulty breathing, chest pain, or persistent vomiting which would prompt immediate return.  Follow up here as needed.      Reuben Likesavid C Brenlyn Beshara, MD 01/09/14 [phone]

## 2014-01-09 NOTE — Discharge Instructions (Signed)
Look up the [place] [facility] [employer]'s [facility] for free dental clinics. Http://www.ncdental.org/ncds/Schedule.asp ° °Get there early and be prepared to wait. [employer] and [employer] have dental hygienist schools that provide low cost routine dental care.  ° °Other resources: °[facility] °[address] °[place], [place] °[phone] ° °Patients with Medicaid: °[facility]                     [facility] °[address]                                [address] °Phone:  [phone]                                                  Phone:  [phone] ° °Dr. Janice Civils °[address] °[phone] ° °If unable to pay or uninsured, contact:  [facility] or [facility] to become qualified for the adult dental clinic. ° °No matter what dental problem you have, it will not get better unless you get good dental care.  If the tooth is not taken care of, your symptoms will come back in time and you will be visiting us again in the Urgent Care Center with a bad toothache.  So, see your dentist as soon as possible.  If you don't have a dentist, we can give you a list of dentists.  Sometimes the most cost effective treatment is removal of the tooth.  This can be done very inexpensively through one of the low cost [facility] Centers such as the facility on [address] in [place] ([phone]).  The downside to this is that you will have one less tooth and this can effect your ability to chew. ° °Some other things that can be done for a dental infection include the following: ° °· Rinse your mouth out with hot salt water (1/2 tsp of table salt and a pinch of baking soda in 8 oz of hot water).  You can do this every 2 or 3 hours. °· Avoid cold foods, beverages, and cold air.  This will make your symptoms worse. °· Sleep with your head elevated.  Sleeping flat will cause your gums and oral tissues to swell and make them hurt  more.  You can sleep on several pillows.  Even better is to sleep in a recliner with your head higher than your heart. °· For mild to moderate pain, you can take Tylenol, ibuprofen, or Aleve. °· External application of heat by a heating pad, hot water bottle, or hot wet towel can help with pain and speed healing.  You can do this every 2 to 3 hours. Do not fall asleep on a heating pad since this can cause a burn.  ° °Go to www.goodrx.com to look up your medications. This will give you a list of where you can find your prescriptions at the most affordable prices.  ° °RESOURCE GUIDE ° °Dental Problems ° °Look up http://www.ncdental.org/ncds/Schedule.asp for a schedule of the [facility] [employer]'s free dental clinics called [facility] [facility]. They have clinics all around [place]. Get there early and be prepared to wait.  ° °[facility] °[address]  [place], [place]   [phone]  [facility] [address] [place], [place] [phone]  Patients with Medicaid: [facility] [phone] [address]                                [phone] Phone:  [phone]                                                  Phone:  [phone]  If unable to pay or uninsured, contact:  [facility] or [facility] to become qualified for the adult dental clinic.   Acute Bronchitis Bronchitis is inflammation of the airways that extend from the windpipe into the lungs (bronchi). The inflammation often causes mucus to develop. This leads to a cough, which is the most common symptom of bronchitis.  In acute bronchitis, the condition usually develops suddenly and goes away over time, usually in a couple weeks. Smoking, allergies, and asthma can make bronchitis worse. Repeated episodes of bronchitis may cause further lung problems.  CAUSES Acute bronchitis is most often caused by the same  virus that causes a cold. The virus can spread from person to person (contagious).  SIGNS AND SYMPTOMS   Cough.   Fever.   Coughing up mucus.   Body aches.   Chest congestion.   Chills.   Shortness of breath.   Sore throat.  DIAGNOSIS  Acute bronchitis is usually diagnosed through a physical exam. Tests, such as chest X-rays, are sometimes done to rule out other conditions.  TREATMENT  Acute bronchitis usually goes away in a couple weeks. Often times, no medical treatment is necessary. Medicines are sometimes given for relief of fever or cough. Antibiotics are usually not needed but may be prescribed in certain situations. In some cases, an inhaler may be recommended to help reduce shortness of breath and control the cough. A cool mist vaporizer may also be used to help thin bronchial secretions and make it easier to clear the chest.  HOME CARE INSTRUCTIONS  Get plenty of rest.   Drink enough fluids to keep your urine clear or pale yellow (unless you have a medical condition that requires fluid restriction). Increasing fluids may help thin your secretions and will prevent dehydration.   Only take over-the-counter or prescription medicines as directed by your health care provider.   Avoid smoking and secondhand smoke. Exposure to cigarette smoke or irritating chemicals will make bronchitis worse. If you are a smoker, consider using nicotine gum or skin patches to help control withdrawal symptoms. Quitting smoking will help your lungs heal faster.   Reduce the chances of another bout of acute bronchitis by washing your hands frequently, avoiding people with cold symptoms, and trying not to touch your hands to your mouth, nose, or eyes.   Follow up with your health care provider as directed.  SEEK MEDICAL CARE IF: Your symptoms do not improve after 1 week of treatment.  SEEK IMMEDIATE MEDICAL CARE IF:  You develop an increased fever or chills.   You have chest  pain.   You have severe shortness of breath.  You have bloody sputum.  You develop dehydration.  You develop fainting.  You develop repeated vomiting.  You develop a severe headache. MAKE SURE YOU:   Understand these instructions.  Will watch your condition.  Will get help right away if you are not doing well or get worse. Document Released: 08/01/2004 Document Revised: 02/24/2013 Document Reviewed: 12/15/2012 [facility] Patient Information 2015 [place], [place]. This information is not intended to replace advice given to you by your health care provider. Make sure you discuss any questions you have with your health care provider. How to Use an Inhaler Proper inhaler technique is very important. Good technique ensures that the medicine reaches the lungs. Poor technique results in depositing the medicine on the tongue and back of the throat rather than in the airways. If you do not use the inhaler with good technique, the medicine will not help you. STEPS TO FOLLOW IF USING AN INHALER WITHOUT AN EXTENSION TUBE 1. Remove the cap from the inhaler. 2. If you are using the inhaler for the first time, you will need to prime it. Shake the inhaler for 5 seconds and release four puffs into the air, away from your face. Ask your health care provider or pharmacist if you have questions about priming your inhaler. 3. Shake the inhaler for 5 seconds before each breath in (inhalation). 4. Position the inhaler so that the top of the canister faces up. 5. Put your index finger on the top of the medicine canister. Your thumb supports the bottom of the inhaler. 6. Open your mouth. 7. Either place the inhaler between your teeth and place your lips tightly around the mouthpiece, or hold the inhaler 1-2 inches away from your open mouth. If you are unsure of which technique to use, ask your health care provider. 8. Breathe out (exhale) normally and as completely as possible. 9. Press the canister down  with your index finger to release the medicine. 10. At the same time as the canister is pressed, inhale deeply and slowly until your lungs are completely filled. This should take 4-6 seconds. Keep your tongue down. 11. Hold the medicine in your lungs for 5-10 seconds (10 seconds is best). This helps the medicine get into the small airways of your lungs. 12. Breathe out slowly, through pursed lips. Whistling is an example of pursed lips. 13. Wait at least 15-30 seconds between puffs. Continue with the above steps until you have taken the number of puffs your health care provider has ordered. Do not use the inhaler more than your health care provider tells you. 14. Replace the cap on the inhaler. 15. Follow the directions from your health care provider or the inhaler insert for cleaning the inhaler. STEPS TO FOLLOW IF USING AN INHALER WITH AN EXTENSION (SPACER) 1. Remove the cap from the inhaler. 2. If you are using the inhaler for the first time, you will need to prime it. Shake the inhaler for 5 seconds and release four puffs into the air, away from your face. Ask your health care provider or pharmacist if you have questions about priming your inhaler. 3. Shake the inhaler for 5 seconds before each breath in (inhalation). 4. Place the open end of the spacer onto the mouthpiece of the inhaler. 5. Position the inhaler so that the top of the canister faces up and the spacer mouthpiece faces you. 6. Put your index finger on the top of the medicine canister. Your thumb supports the bottom of the inhaler and the spacer. 7. Breathe out (exhale) normally  and as completely as possible. 8. Immediately after exhaling, place the spacer between your teeth and into your mouth. Close your lips tightly around the spacer. 9. Press the canister down with your index finger to release the medicine. 10. At the same time as the canister is pressed, inhale deeply and slowly until your lungs are completely filled. This  should take 4-6 seconds. Keep your tongue down and out of the way. 11. Hold the medicine in your lungs for 5-10 seconds (10 seconds is best). This helps the medicine get into the small airways of your lungs. Exhale. 12. Repeat inhaling deeply through the spacer mouthpiece. Again hold that breath for up to 10 seconds (10 seconds is best). Exhale slowly. If it is difficult to take this second deep breath through the spacer, breathe normally several times through the spacer. Remove the spacer from your mouth. 13. Wait at least 15-30 seconds between puffs. Continue with the above steps until you have taken the number of puffs your health care provider has ordered. Do not use the inhaler more than your health care provider tells you. 14. Remove the spacer from the inhaler, and place the cap on the inhaler. 15. Follow the directions from your health care provider or the inhaler insert for cleaning the inhaler and spacer. If you are using different kinds of inhalers, use your quick relief medicine to open the airways 10-15 minutes before using a steroid if instructed to do so by your health care provider. If you are unsure which inhalers to use and the order of using them, ask your health care provider, nurse, or respiratory therapist. If you are using a steroid inhaler, always rinse your mouth with water after your last puff, then gargle and spit out the water. Do not swallow the water. AVOID:  Inhaling before or after starting the spray of medicine. It takes practice to coordinate your breathing with triggering the spray.  Inhaling through the nose (rather than the mouth) when triggering the spray. HOW TO DETERMINE IF YOUR INHALER IS FULL OR NEARLY EMPTY You cannot know when an inhaler is empty by shaking it. A few inhalers are now being made with dose counters. Ask your health care provider for a prescription that has a dose counter if you feel you need that extra help. If your inhaler does not have a  counter, ask your health care provider to help you determine the date you need to refill your inhaler. Write the refill date on a calendar or your inhaler canister. Refill your inhaler 7-10 days before it runs out. Be sure to keep an adequate supply of medicine. This includes making sure it is not expired, and that you have a spare inhaler.  SEEK MEDICAL CARE IF:   Your symptoms are only partially relieved with your inhaler.  You are having trouble using your inhaler.  You have some increase in phlegm. SEEK IMMEDIATE MEDICAL CARE IF:   You feel little or no relief with your inhalers. You are still wheezing and are feeling shortness of breath or tightness in your chest or both.  You have dizziness, headaches, or a fast heart rate.  You have chills, fever, or night sweats.  You have a noticeable increase in phlegm production, or there is blood in the phlegm. MAKE SURE YOU:   Understand these instructions.  Will watch your condition.  Will get help right away if you are not doing well or get worse. Document Released: 06/21/2000 Document Revised: 04/14/2013 Document Reviewed:  01/21/2013 ExitCare Patient Information 2015 [place], [place]. This information is not intended to replace advice given to you by your health care provider. Make sure you discuss any questions you have with your health care provider.

## 2014-01-11 LAB — CULTURE, GROUP A STREP

## 2014-03-10 ENCOUNTER — Encounter (HOSPITAL_COMMUNITY): Payer: Self-pay | Admitting: Emergency Medicine

## 2014-03-10 ENCOUNTER — Emergency Department (INDEPENDENT_AMBULATORY_CARE_PROVIDER_SITE_OTHER)
Admission: EM | Admit: 2014-03-10 | Discharge: 2014-03-10 | Disposition: A | Discharge status: Home / Self Care | Payer: Medicaid Other | Source: Home / Self Care | Attending: Emergency Medicine | Admitting: Emergency Medicine

## 2014-03-10 DIAGNOSIS — J209 Acute bronchitis, unspecified: Secondary | ICD-10-CM

## 2014-03-10 DIAGNOSIS — K047 Periapical abscess without sinus: Secondary | ICD-10-CM

## 2014-03-10 MED ORDER — BENZONATATE 100 MG PO CAPS: 100.0000 mg | ORAL_CAPSULE | Freq: Three times a day (TID) | ORAL | Status: AC | PRN

## 2014-03-10 MED ORDER — ALBUTEROL SULFATE HFA 108 (90 BASE) MCG/ACT IN AERS: 1.0000 | INHALATION_SPRAY | Freq: Four times a day (QID) | RESPIRATORY_TRACT | Status: AC | PRN

## 2014-03-10 MED ORDER — PREDNISONE 10 MG PO TABS: ORAL_TABLET | ORAL | Status: AC

## 2014-03-10 MED ORDER — CLINDAMYCIN HCL 300 MG PO CAPS: 300.0000 mg | ORAL_CAPSULE | Freq: Four times a day (QID) | ORAL | Status: AC

## 2014-03-10 NOTE — ED Notes (Signed)
Minor child being evaluated in same treatment room

## 2014-03-10 NOTE — ED Provider Notes (Signed)
Medical screening examination/treatment/procedure(s) were performed by non-physician practitioner and as supervising physician I was immediately available for consultation/collaboration.  Leslee Home, M.D.   Reuben Likes, MD 03/10/14 [phone]

## 2014-03-10 NOTE — ED Provider Notes (Signed)
CSN: [id number]     Arrival date & time 03/10/14  1111 History   First MD Initiated Contact with Patient 03/10/14 1127     Chief Complaint  Patient presents with  . URI  . Cough   (Consider location/radiation/quality/duration/timing/severity/associated sxs/prior Treatment) HPI Comments: Patient presents with request for evaluation of two separate issue.  First she reports a two week hx of productive cough with associated wheezing but without fever. States she has a history of chronic bronchitis and is a smoker. Denies chest pain, dyspnea or hemoptysis. Next, she reports several day history of left lower molar dental pain with increased facial pain and swelling over past 24-48 hours without associated fever.  PCP: Dr. Parke Simmers Reports her daughter also recently developed URI sx.  Unemployed   Patient is a 33 y.o. female presenting with URI and cough.  URI Presenting symptoms: congestion, cough and ear pain   Presenting symptoms: no facial pain, no fatigue, no fever, no rhinorrhea and no sore throat   Presenting symptoms comment:  Left ear pain Associated symptoms: wheezing   Cough Associated symptoms: ear pain and wheezing   Associated symptoms: no chills, no fever, no rhinorrhea, no shortness of breath and no sore throat     Past Medical History  Diagnosis Date  . Migraine   . 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 Gallbladder surgery  1995    removed  . Adenoidectomy, tonsillectomy and myringotomy with tube placement  1998  . External auditory canal reconstruction      eardrum repair    No family history on file. History  Substance Use Topics  . Smoking status: Current Every Day Smoker -- 0.50 packs/day    Types: Cigarettes  . Smokeless tobacco: Not on file  . Alcohol Use: No   OB History   Grav Para Term Preterm Abortions TAB SAB Ect Mult Living                 Review of Systems  Constitutional: Negative for fever,  chills and fatigue.  HENT: Positive for congestion, dental problem and ear pain. Negative for rhinorrhea and sore throat.   Eyes: Negative.   Respiratory: Positive for cough and wheezing. Negative for chest tightness and shortness of breath.   Cardiovascular: Negative.   Gastrointestinal: Negative.   Endocrine: Negative for polydipsia, polyphagia and polyuria.  Genitourinary: Negative.   Musculoskeletal: Negative.   Skin: Negative.   Neurological: Negative.     Allergies  Cephalosporins; Penicillins; and Wellbutrin  Home Medications   Prior to Admission medications   Medication Sig Start Date End Date Taking? Authorizing Provider  albuterol (PROVENTIL HFA;VENTOLIN HFA) 108 (90 BASE) MCG/ACT inhaler Inhale 2 puffs into the lungs 4 (four) times daily. 01/09/14   Reuben Likes, MD  albuterol (PROVENTIL HFA;VENTOLIN HFA) 108 (90 BASE) MCG/ACT inhaler Inhale 1-2 puffs into the lungs every 6 (six) hours as needed for wheezing or shortness of breath. 03/10/14   Mathis Fare Eryanna Regal, PA  benzonatate (TESSALON) 100 MG capsule Take 1 capsule (100 mg total) by mouth 3 (three) times daily as needed for cough. 03/10/14   Mathis Fare Margalit Leece, PA  clindamycin (CLEOCIN) 150 MG capsule Take 2 capsules (300 mg total) by mouth 4 (four) times daily. 10/18/13   Trixie Dredge, PA-C  clindamycin (CLEOCIN) 300 MG capsule Take 1 capsule (300 mg total) by mouth 4 (four) times daily. 01/09/14   Reuben Likes, MD  clindamycin (CLEOCIN) 300 MG capsule Take 1 capsule (300 mg total) by mouth 4 (four) times daily. X 7 days 03/10/14   Ria Clock, PA  HYDROcodone-acetaminophen (NORCO/VICODIN) 5-325 MG per tablet Take 1 tablet by mouth every 6 (six) hours as needed for moderate pain or severe pain. 10/18/13   Trixie Dredge, PA-C  ibuprofen (ADVIL,MOTRIN) 800 MG tablet Take 800 mg by mouth every 8 (eight) hours as needed for moderate pain.    Historical Provider, MD  oxyCODONE-acetaminophen (PERCOCET) 5-325 MG per tablet 1  to 2 tablets every 6 hours as needed for pain. 01/09/14   Reuben Likes, MD  predniSONE (DELTASONE) 10 MG tablet Take 4 tabs po qd day 1, 3 tabs po qd day 2, 2 tabs po qd day 3 and 1 tab po qd day 4 then stop 03/10/14   Mathis Fare Monea Pesantez, PA  predniSONE (DELTASONE) 20 MG tablet Take 1 tablet (20 mg total) by mouth 2 (two) times daily. 01/09/14   Reuben Likes, MD  traMADol (ULTRAM) 50 MG tablet 1 to 2 tablets every 8 hours as needed for cough. 01/09/14   Reuben Likes, MD   BP 112/61  Pulse 78  Temp(Src) 98 F (36.7 C) (Oral)  Resp 18  SpO2 98%  LMP 02/15/2014 Physical Exam  Nursing note and vitals reviewed. Constitutional: She is oriented to person, place, and time. She appears well-developed and well-nourished. No distress.  HENT:  Head: Normocephalic and atraumatic.  Right Ear: Hearing, tympanic membrane, external ear and ear canal normal.  Left Ear: Hearing, tympanic membrane, external ear and ear canal normal.  Nose: Nose normal.  Mouth/Throat: Uvula is midline, oropharynx is clear and moist and mucous membranes are normal. No oral lesions. No trismus in the jaw. Dental caries present.  Generalized poor dentition  Eyes: Conjunctivae are normal. Right eye exhibits no discharge. Left eye exhibits no discharge. No scleral icterus.  Neck: Normal range of motion. Neck supple.  Cardiovascular: Normal rate, regular rhythm and normal heart sounds.   Pulmonary/Chest: Effort normal. No respiratory distress. She has wheezes. She has no rales. She exhibits no tenderness.  @ bilateral bases  Musculoskeletal: Normal range of motion.  Lymphadenopathy:    She has no cervical adenopathy.  Neurological: She is alert and oriented to person, place, and time.  Skin: Skin is warm and dry. No rash noted. No erythema.  Psychiatric: She has a normal mood and affect. Her behavior is normal.    ED Course  Procedures (including critical care time) Labs Review Labs Reviewed - No data to  display  Imaging Review No results found.   MDM   1. Bronchitis, acute, with bronchospasm   2. Dental abscess    Likely acute exacerbation of chronic bronchitis precipitated by viral URI. Vital signs normal and without tachypnea, tachycardia, fever or hypoxia.  Will treat with cough suppressant (tessalon), bronchodilators, and short course of oral steroids and advise PCP if no improvement. Potential left lower molar periapical dental abscess. Will treat with clindamycin and advise dental follow up if no improvement.   Ria Clock, [place] 03/10/14 1213

## 2014-03-10 NOTE — Discharge Instructions (Signed)
Acute Bronchitis Bronchitis is inflammation of the airways that extend from the windpipe into the lungs (bronchi). The inflammation often causes mucus to develop. This leads to a cough, which is the most common symptom of bronchitis.  In acute bronchitis, the condition usually develops suddenly and goes away over time, usually in a couple weeks. Smoking, allergies, and asthma can make bronchitis worse. Repeated episodes of bronchitis may cause further lung problems.  CAUSES Acute bronchitis is most often caused by the same virus that causes a cold. The virus can spread from person to person (contagious) through coughing, sneezing, and touching contaminated objects. SIGNS AND SYMPTOMS   Cough.   Fever.   Coughing up mucus.   Body aches.   Chest congestion.   Chills.   Shortness of breath.   Sore throat.  DIAGNOSIS  Acute bronchitis is usually diagnosed through a physical exam. Your health care provider will also ask you questions about your medical history. Tests, such as chest X-rays, are sometimes done to rule out other conditions.  TREATMENT  Acute bronchitis usually goes away in a couple weeks. Oftentimes, no medical treatment is necessary. Medicines are sometimes given for relief of fever or cough. Antibiotic medicines are usually not needed but may be prescribed in certain situations. In some cases, an inhaler may be recommended to help reduce shortness of breath and control the cough. A cool mist vaporizer may also be used to help thin bronchial secretions and make it easier to clear the chest.  HOME CARE INSTRUCTIONS  Get plenty of rest.   Drink enough fluids to keep your urine clear or pale yellow (unless you have a medical condition that requires fluid restriction). Increasing fluids may help thin your respiratory secretions (sputum) and reduce chest congestion, and it will prevent dehydration.   Take medicines only as directed by your health care provider.  If  you were prescribed an antibiotic medicine, finish it all even if you start to feel better.  Avoid smoking and secondhand smoke. Exposure to cigarette smoke or irritating chemicals will make bronchitis worse. If you are a smoker, consider using nicotine gum or skin patches to help control withdrawal symptoms. Quitting smoking will help your lungs heal faster.   Reduce the chances of another bout of acute bronchitis by washing your hands frequently, avoiding people with cold symptoms, and trying not to touch your hands to your mouth, nose, or eyes.   Keep all follow-up visits as directed by your health care provider.  SEEK MEDICAL CARE IF: Your symptoms do not improve after 1 week of treatment.  SEEK IMMEDIATE MEDICAL CARE IF:  You develop an increased fever or chills.   You have chest pain.   You have severe shortness of breath.  You have bloody sputum.   You develop dehydration.  You faint or repeatedly feel like you are going to pass out.  You develop repeated vomiting.  You develop a severe headache. MAKE SURE YOU:   Understand these instructions.  Will watch your condition.  Will get help right away if you are not doing well or get worse. Document Released: 08/01/2004 Document Revised: 11/08/2013 Document Reviewed: 12/15/2012 [facility] Patient Information 2015 [place], [place]. This information is not intended to replace advice given to you by your health care provider. Make sure you discuss any questions you have with your health care provider.  Cough, Adult  A cough is a reflex that helps clear your throat and airways. It can help heal the body or may  be a reaction to an irritated airway. A cough may only last 2 or 3 weeks (acute) or may last more than 8 weeks (chronic).  CAUSES Acute cough:  Viral or bacterial infections. Chronic cough:  Infections.  Allergies.  Asthma.  Post-nasal drip.  Smoking.  Heartburn or acid reflux.  Some  medicines.  Chronic lung problems (COPD).  Cancer. SYMPTOMS   Cough.  Fever.  Chest pain.  Increased breathing rate.  High-pitched whistling sound when breathing (wheezing).  Colored mucus that you cough up (sputum). TREATMENT   A bacterial cough may be treated with antibiotic medicine.  A viral cough must run its course and will not respond to antibiotics.  Your caregiver may recommend other treatments if you have a chronic cough. HOME CARE INSTRUCTIONS   Only take over-the-counter or prescription medicines for pain, discomfort, or fever as directed by your caregiver. Use cough suppressants only as directed by your caregiver.  Use a cold steam vaporizer or humidifier in your bedroom or home to help loosen secretions.  Sleep in a semi-upright position if your cough is worse at night.  Rest as needed.  Stop smoking if you smoke. SEEK IMMEDIATE MEDICAL CARE IF:   You have pus in your sputum.  Your cough starts to worsen.  You cannot control your cough with suppressants and are losing sleep.  You begin coughing up blood.  You have difficulty breathing.  You develop pain which is getting worse or is uncontrolled with medicine.  You have a fever. MAKE SURE YOU:   Understand these instructions.  Will watch your condition.  Will get help right away if you are not doing well or get worse. Document Released: 12/21/2010 Document Revised: 09/16/2011 Document Reviewed: 12/21/2010 [facility] Patient Information 2015 [place], [place]. This information is not intended to replace advice given to you by your health care provider. Make sure you discuss any questions you have with your health care provider.  Upper Respiratory Infection, Adult An upper respiratory infection (URI) is also sometimes known as the common cold. The upper respiratory tract includes the nose, sinuses, throat, trachea, and bronchi. Bronchi are the airways leading to the lungs. Most people improve  within 1 week, but symptoms can last up to 2 weeks. A residual cough may last even longer.  CAUSES Many different viruses can infect the tissues lining the upper respiratory tract. The tissues become irritated and inflamed and often become very moist. Mucus production is also common. A cold is contagious. You can easily spread the virus to others by oral contact. This includes kissing, sharing a glass, coughing, or sneezing. Touching your mouth or nose and then touching a surface, which is then touched by another person, can also spread the virus. SYMPTOMS  Symptoms typically develop 1 to 3 days after you come in contact with a cold virus. Symptoms vary from person to person. They may include:  Runny nose.  Sneezing.  Nasal congestion.  Sinus irritation.  Sore throat.  Loss of voice (laryngitis).  Cough.  Fatigue.  Muscle aches.  Loss of appetite.  Headache.  Low-grade fever. DIAGNOSIS  You might diagnose your own cold based on familiar symptoms, since most people get a cold 2 to 3 times a year. Your caregiver can confirm this based on your exam. Most importantly, your caregiver can check that your symptoms are not due to another disease such as strep throat, sinusitis, pneumonia, asthma, or epiglottitis. Blood tests, throat tests, and X-rays are not necessary to diagnose a common  cold, but they may sometimes be helpful in excluding other more serious diseases. Your caregiver will decide if any further tests are required. RISKS AND COMPLICATIONS  You may be at risk for a more severe case of the common cold if you smoke cigarettes, have chronic heart disease (such as heart failure) or lung disease (such as asthma), or if you have a weakened immune system. The very young and very old are also at risk for more serious infections. Bacterial sinusitis, middle ear infections, and bacterial pneumonia can complicate the common cold. The common cold can worsen asthma and chronic obstructive  pulmonary disease (COPD). Sometimes, these complications can require emergency medical care and may be life-threatening. PREVENTION  The best way to protect against getting a cold is to practice good hygiene. Avoid oral or hand contact with people with cold symptoms. Wash your hands often if contact occurs. There is no clear evidence that vitamin C, vitamin E, echinacea, or exercise reduces the chance of developing a cold. However, it is always recommended to get plenty of rest and practice good nutrition. TREATMENT  Treatment is directed at relieving symptoms. There is no cure. Antibiotics are not effective, because the infection is caused by a virus, not by bacteria. Treatment may include:  Increased fluid intake. Sports drinks offer valuable electrolytes, sugars, and fluids.  Breathing heated mist or steam (vaporizer or shower).  Eating chicken soup or other clear broths, and maintaining good nutrition.  Getting plenty of rest.  Using gargles or lozenges for comfort.  Controlling fevers with ibuprofen or acetaminophen as directed by your caregiver.  Increasing usage of your inhaler if you have asthma. Zinc gel and zinc lozenges, taken in the first 24 hours of the common cold, can shorten the duration and lessen the severity of symptoms. Pain medicines may help with fever, muscle aches, and throat pain. A variety of non-prescription medicines are available to treat congestion and runny nose. Your caregiver can make recommendations and may suggest nasal or lung inhalers for other symptoms.  HOME CARE INSTRUCTIONS   Only take over-the-counter or prescription medicines for pain, discomfort, or fever as directed by your caregiver.  Use a warm mist humidifier or inhale steam from a shower to increase air moisture. This may keep secretions moist and make it easier to breathe.  Drink enough water and fluids to keep your urine clear or pale yellow.  Rest as needed.  Return to work when your  temperature has returned to normal or as your caregiver advises. You may need to stay home longer to avoid infecting others. You can also use a face mask and careful hand washing to prevent spread of the virus. SEEK MEDICAL CARE IF:   After the first few days, you feel you are getting worse rather than better.  You need your caregiver's advice about medicines to control symptoms.  You develop chills, worsening shortness of breath, or brown or red sputum. These may be signs of pneumonia.  You develop yellow or brown nasal discharge or pain in the face, especially when you bend forward. These may be signs of sinusitis.  You develop a fever, swollen neck glands, pain with swallowing, or white areas in the back of your throat. These may be signs of strep throat. SEEK IMMEDIATE MEDICAL CARE IF:   You have a fever.  You develop severe or persistent headache, ear pain, sinus pain, or chest pain.  You develop wheezing, a prolonged cough, cough up blood, or have a change in  your usual mucus (if you have chronic lung disease).  You develop sore muscles or a stiff neck. Document Released: 12/18/2000 Document Revised: 09/16/2011 Document Reviewed: 09/29/2013 [facility] Patient Information 2015 [place], [place]. This information is not intended to replace advice given to you by your health care provider. Make sure you discuss any questions you have with your health care provider.  Dental Abscess A dental abscess is a collection of infected fluid (pus) from a bacterial infection in the inner part of the tooth (pulp). It usually occurs at the end of the tooth's root.  CAUSES   Severe tooth decay.  Trauma to the tooth that allows bacteria to enter into the pulp, such as a broken or chipped tooth. SYMPTOMS   Severe pain in and around the infected tooth.  Swelling and redness around the abscessed tooth or in the mouth or face.  Tenderness.  Pus drainage.  Bad breath.  Bitter taste in the  mouth.  Difficulty swallowing.  Difficulty opening the mouth.  Nausea.  Vomiting.  Chills.  Swollen neck glands. DIAGNOSIS   A medical and dental history will be taken.  An examination will be performed by tapping on the abscessed tooth.  X-rays may be taken of the tooth to identify the abscess. TREATMENT The goal of treatment is to eliminate the infection. You may be prescribed antibiotic medicine to stop the infection from spreading. A root canal may be performed to save the tooth. If the tooth cannot be saved, it may be pulled (extracted) and the abscess may be drained.  HOME CARE INSTRUCTIONS  Only take over-the-counter or prescription medicines for pain, fever, or discomfort as directed by your caregiver.  Rinse your mouth (gargle) often with salt water ( tsp salt in 8 oz [250 ml] of warm water) to relieve pain or swelling.  Do not drive after taking pain medicine (narcotics).  Do not apply heat to the outside of your face.  Return to your dentist for further treatment as directed. SEEK MEDICAL CARE IF:  Your pain is not helped by medicine.  Your pain is getting worse instead of better. SEEK IMMEDIATE MEDICAL CARE IF:  You have a fever or persistent symptoms for more than 2-3 days.  You have a fever and your symptoms suddenly get worse.  You have chills or a very bad headache.  You have problems breathing or swallowing.  You have trouble opening your mouth.  You have swelling in the neck or around the eye. Document Released: 06/24/2005 Document Revised: 03/18/2012 Document Reviewed: 10/02/2010 [facility] Patient Information 2015 [place], [place]. This information is not intended to replace advice given to you by your health care provider. Make sure you discuss any questions you have with your health care provider.  Upper Respiratory Infection, Adult An upper respiratory infection (URI) is also sometimes known as the common cold. The upper respiratory tract  includes the nose, sinuses, throat, trachea, and bronchi. Bronchi are the airways leading to the lungs. Most people improve within 1 week, but symptoms can last up to 2 weeks. A residual cough may last even longer.  CAUSES Many different viruses can infect the tissues lining the upper respiratory tract. The tissues become irritated and inflamed and often become very moist. Mucus production is also common. A cold is contagious. You can easily spread the virus to others by oral contact. This includes kissing, sharing a glass, coughing, or sneezing. Touching your mouth or nose and then touching a surface, which is then touched by another  person, can also spread the virus. SYMPTOMS  Symptoms typically develop 1 to 3 days after you come in contact with a cold virus. Symptoms vary from person to person. They may include:  Runny nose.  Sneezing.  Nasal congestion.  Sinus irritation.  Sore throat.  Loss of voice (laryngitis).  Cough.  Fatigue.  Muscle aches.  Loss of appetite.  Headache.  Low-grade fever. DIAGNOSIS  You might diagnose your own cold based on familiar symptoms, since most people get a cold 2 to 3 times a year. Your caregiver can confirm this based on your exam. Most importantly, your caregiver can check that your symptoms are not due to another disease such as strep throat, sinusitis, pneumonia, asthma, or epiglottitis. Blood tests, throat tests, and X-rays are not necessary to diagnose a common cold, but they may sometimes be helpful in excluding other more serious diseases. Your caregiver will decide if any further tests are required. RISKS AND COMPLICATIONS  You may be at risk for a more severe case of the common cold if you smoke cigarettes, have chronic heart disease (such as heart failure) or lung disease (such as asthma), or if you have a weakened immune system. The very young and very old are also at risk for more serious infections. Bacterial sinusitis, middle ear  infections, and bacterial pneumonia can complicate the common cold. The common cold can worsen asthma and chronic obstructive pulmonary disease (COPD). Sometimes, these complications can require emergency medical care and may be life-threatening. PREVENTION  The best way to protect against getting a cold is to practice good hygiene. Avoid oral or hand contact with people with cold symptoms. Wash your hands often if contact occurs. There is no clear evidence that vitamin C, vitamin E, echinacea, or exercise reduces the chance of developing a cold. However, it is always recommended to get plenty of rest and practice good nutrition. TREATMENT  Treatment is directed at relieving symptoms. There is no cure. Antibiotics are not effective, because the infection is caused by a virus, not by bacteria. Treatment may include:  Increased fluid intake. Sports drinks offer valuable electrolytes, sugars, and fluids.  Breathing heated mist or steam (vaporizer or shower).  Eating chicken soup or other clear broths, and maintaining good nutrition.  Getting plenty of rest.  Using gargles or lozenges for comfort.  Controlling fevers with ibuprofen or acetaminophen as directed by your caregiver.  Increasing usage of your inhaler if you have asthma. Zinc gel and zinc lozenges, taken in the first 24 hours of the common cold, can shorten the duration and lessen the severity of symptoms. Pain medicines may help with fever, muscle aches, and throat pain. A variety of non-prescription medicines are available to treat congestion and runny nose. Your caregiver can make recommendations and may suggest nasal or lung inhalers for other symptoms.  HOME CARE INSTRUCTIONS   Only take over-the-counter or prescription medicines for pain, discomfort, or fever as directed by your caregiver.  Use a warm mist humidifier or inhale steam from a shower to increase air moisture. This may keep secretions moist and make it easier to  breathe.  Drink enough water and fluids to keep your urine clear or pale yellow.  Rest as needed.  Return to work when your temperature has returned to normal or as your caregiver advises. You may need to stay home longer to avoid infecting others. You can also use a face mask and careful hand washing to prevent spread of the virus. SEEK MEDICAL CARE  IF:   After the first few days, you feel you are getting worse rather than better.  You need your caregiver's advice about medicines to control symptoms.  You develop chills, worsening shortness of breath, or brown or red sputum. These may be signs of pneumonia.  You develop yellow or brown nasal discharge or pain in the face, especially when you bend forward. These may be signs of sinusitis.  You develop a fever, swollen neck glands, pain with swallowing, or white areas in the back of your throat. These may be signs of strep throat. SEEK IMMEDIATE MEDICAL CARE IF:   You have a fever.  You develop severe or persistent headache, ear pain, sinus pain, or chest pain.  You develop wheezing, a prolonged cough, cough up blood, or have a change in your usual mucus (if you have chronic lung disease).  You develop sore muscles or a stiff neck. Document Released: 12/18/2000 Document Revised: 09/16/2011 Document Reviewed: 09/29/2013 [facility] Patient Information 2015 [place], [place]. This information is not intended to replace advice given to you by your health care provider. Make sure you discuss any questions you have with your health care provider.

## 2014-03-10 NOTE — ED Notes (Signed)
Cough, chest congestion.  Patient reports being treated for bronchitis in July.  Patient reports cough never went away.  Reports cough recently worsened.  Reports coughing up green phlegm.  Patient reports pain on left bottom of mouth and left ear pain.

## 2014-07-25 IMAGING — CR DG ANKLE COMPLETE 3+V*L*
3 series · 3 of 3 positions shown · non-contrast
Comparison: None.

CLINICAL DATA: 32-year-old female status post fall and twisting
injury with pain and swelling.

EXAM:
LEFT ANKLE COMPLETE - 3+ VIEW

[t ankle joint ap left]
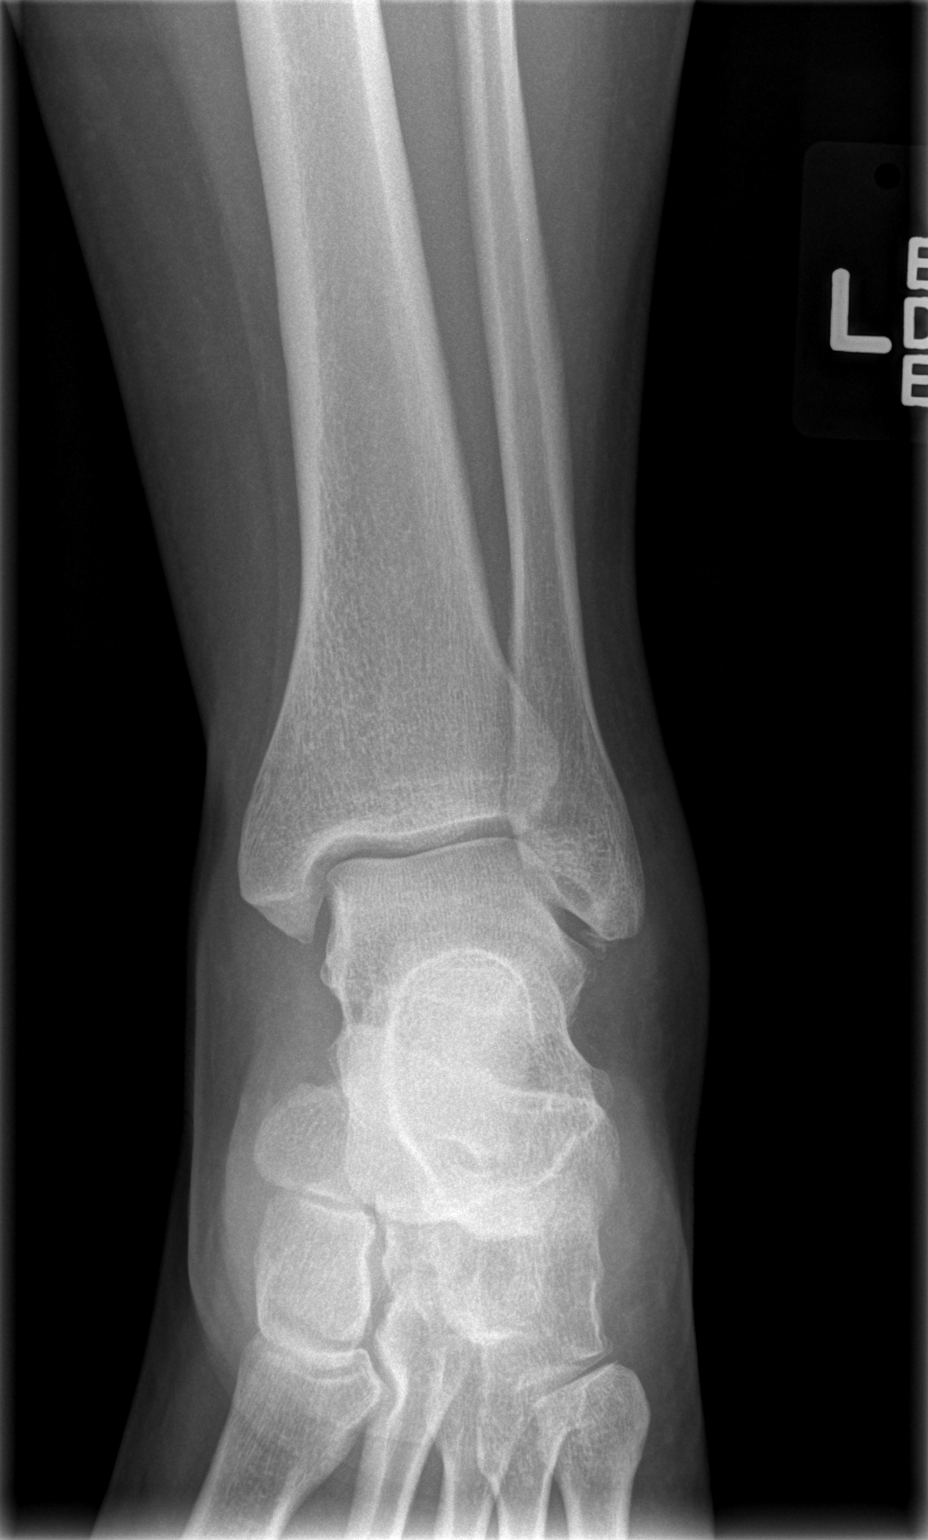

[t ankle joint oblique left]
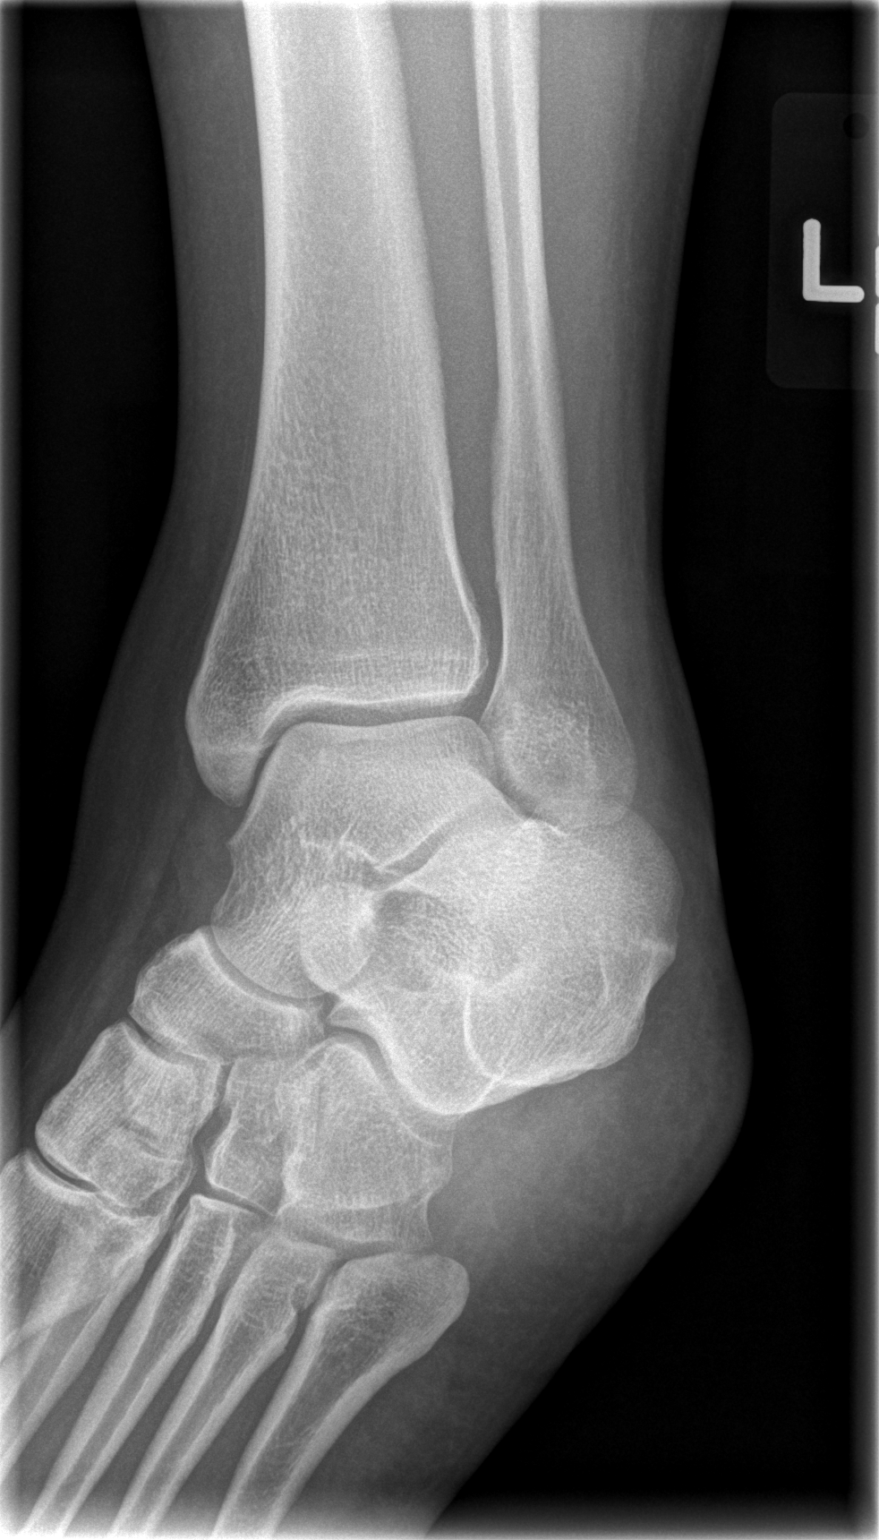

[t ankle joint lat left]
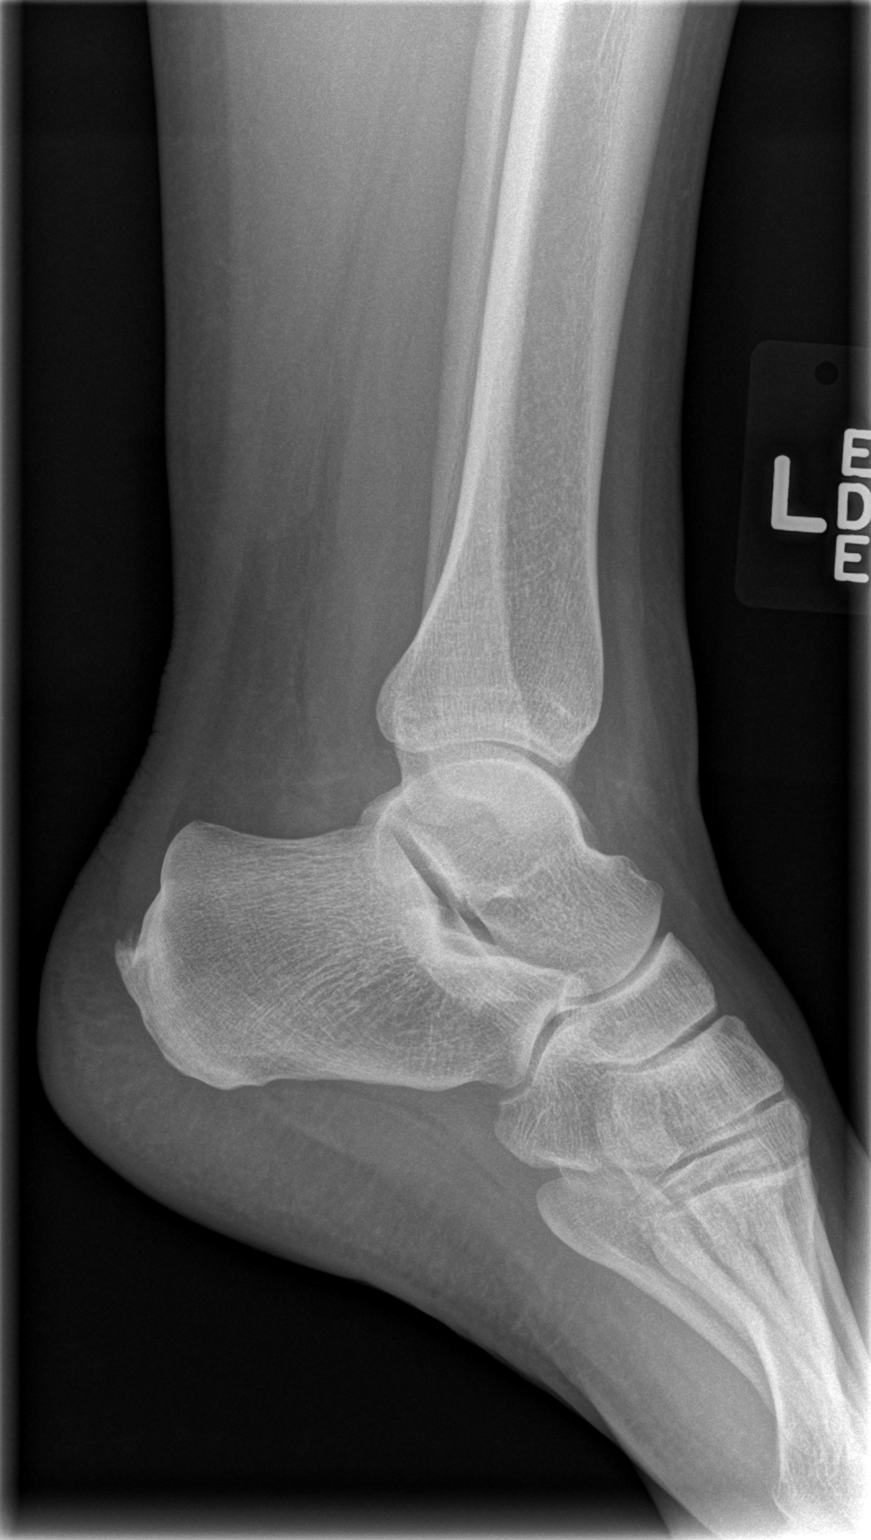

[3 of 3 positions shown; findings below may reference images not displayed]

FINDINGS: Bone mineralization is within normal limits. Preserved mortise joint
alignment. Talar dome intact. Small ossific fragment distal to the
lateral malleolus, but appears to be chronic. No joint effusion
identified. Calcaneus intact. No acute fracture identified.
IMPRESSION: Small chronic appearing ossific fragment adjacent to the lateral
malleolus. No acute fracture or dislocation identified about the
left ankle.
